# Patient Record
Sex: Male | Born: 1975 | Race: Black or African American | Hispanic: No | Marital: Married | Smoking: Never smoker
Health system: Southern US, Community
[De-identification: ages and names within clinical notes are randomized; demographics above are authoritative.]

## PROBLEM LIST (undated history)

## (undated) DIAGNOSIS — E7800 Pure hypercholesterolemia, unspecified: Secondary | ICD-10-CM

## (undated) DIAGNOSIS — I10 Essential (primary) hypertension: Secondary | ICD-10-CM

---

## 2011-12-15 ENCOUNTER — Emergency Department (HOSPITAL_COMMUNITY)
Admission: EM | Admit: 2011-12-15 | Discharge: 2011-12-15 | Disposition: A | Discharge status: Home / Self Care | Payer: Self-pay | Attending: Emergency Medicine | Admitting: Emergency Medicine

## 2011-12-15 ENCOUNTER — Emergency Department (HOSPITAL_COMMUNITY): Payer: Self-pay

## 2011-12-15 ENCOUNTER — Encounter (HOSPITAL_COMMUNITY): Payer: Self-pay | Admitting: *Deleted

## 2011-12-15 DIAGNOSIS — W3189XA Contact with other specified machinery, initial encounter: Secondary | ICD-10-CM | POA: Insufficient documentation

## 2011-12-15 DIAGNOSIS — E7800 Pure hypercholesterolemia, unspecified: Secondary | ICD-10-CM | POA: Insufficient documentation

## 2011-12-15 DIAGNOSIS — Y9289 Other specified places as the place of occurrence of the external cause: Secondary | ICD-10-CM | POA: Insufficient documentation

## 2011-12-15 DIAGNOSIS — S8010XA Contusion of unspecified lower leg, initial encounter: Secondary | ICD-10-CM | POA: Insufficient documentation

## 2011-12-15 HISTORY — DX: Pure hypercholesterolemia, unspecified: E78.00

## 2011-12-15 MED ORDER — HYDROCODONE-ACETAMINOPHEN 5-325 MG PO TABS
1.0000 | ORAL_TABLET | Freq: Once | ORAL | Status: AC
  Administered 2011-12-15: 1 via ORAL
  Filled 2011-12-15: qty 1

## 2011-12-15 MED ORDER — TRAMADOL HCL 50 MG PO TABS: 50.0000 mg | ORAL_TABLET | Freq: Four times a day (QID) | ORAL | Status: AC | PRN

## 2011-12-15 MED ORDER — IBUPROFEN 600 MG PO TABS: 600.0000 mg | ORAL_TABLET | Freq: Four times a day (QID) | ORAL | Status: AC | PRN

## 2011-12-15 NOTE — ED Notes (Signed)
PT had a electric scrubber hit him in the left shin area and has swelling and bruising to area.  Pulse present.    Unable to bear weight

## 2011-12-15 NOTE — ED Provider Notes (Signed)
History  This chart was scribed for Loren Racer, MD by Bennett Scrape. This patient was seen in room STRE1/STRE1 and the patient's care was started at 3:51PM.  CSN: [id number]  Arrival date & time 12/15/11  1531   First MD Initiated Contact with Patient 12/15/11 1551      Chief Complaint  Patient presents with  . Leg Pain    left    Patient is a 36 y.o. male presenting with leg pain. The history is provided by the patient. No language interpreter was used.  Leg Pain  The incident occurred 3 to 5 hours ago. The incident occurred at work. The injury mechanism was a direct blow. The pain is present in the left leg. The pain has been constant since onset. Pertinent negatives include no inability to bear weight. The symptoms are aggravated by bearing weight. He has tried nothing for the symptoms.     Dennis Beasley is a 36 y.o. male with a h/o hypercholesteremia who presents to the Emergency Department complaining of a LLE injury that occurred around 10:30AM today. Pt states that he had a electric floor scrubber hit him in the left shin area at his job. He has swelling and bruising to area. The pain is worse with walking but pt states that he has been able to walk on the left leg since the incident. He has not tried any treatments or OTC medications PTA. He denies any other associated symptoms currently. He denies smoking and alcohol use.  Past Medical History  Diagnosis Date  . Hypercholesteremia     History reviewed. No pertinent past surgical history.  No family history on file.  History  Substance Use Topics  . Smoking status: Never Smoker   . Smokeless tobacco: Not on file  . Alcohol Use: No      Review of Systems  Constitutional: Negative for fever and chills.  Respiratory: Negative for shortness of breath.   Gastrointestinal: Negative for nausea and vomiting.  Musculoskeletal:       LLE pain and swelling  Skin:       Contusion to the LLE  Neurological: Negative  for weakness.    Allergies  Review of patient's allergies indicates no known allergies.  Home Medications   Current Outpatient Rx  Name Route Sig Dispense Refill  . IBUPROFEN 600 MG PO TABS Oral Take 1 tablet (600 mg total) by mouth every 6 (six) hours as needed for pain. 30 tablet 0  . TRAMADOL HCL 50 MG PO TABS Oral Take 1 tablet (50 mg total) by mouth every 6 (six) hours as needed for pain. 15 tablet 0    Triage Vitals: BP 206/125  Pulse 108  Temp(Src) 98.1 F (36.7 C) (Oral)  Resp 21  SpO2 100%  Physical Exam  Nursing note and vitals reviewed. Constitutional: He is oriented to person, place, and time. He appears well-developed and well-nourished. No distress.  HENT:  Head: Normocephalic and atraumatic.  Eyes: EOM are normal.  Neck: Neck supple. No tracheal deviation present.  Cardiovascular: Normal rate.   Pulmonary/Chest: Effort normal. No respiratory distress.  Musculoskeletal: Normal range of motion.       Mild swelling and tenderness of the LLE around the contusion, no obvious deformity, no motor deficit  Neurological: He is alert and oriented to person, place, and time.       No sensatory deficit  Skin: Skin is warm and dry.       Contusion on the LLE   Psychiatric:  He has a normal mood and affect. His behavior is normal.    ED Course  Procedures (including critical care time)  DIAGNOSTIC STUDIES: Oxygen Saturation is 100% on room air, normal by my interpretation.    COORDINATION OF CARE: 4:00PM-Discussed treatment plan with pt and pt agreed to plan. 5:18PM-Informed pt of radiology report. Discussed discharge plan with pt and pt agreed to plan.  Labs Reviewed - No data to display Dg Tibia/fibula Left  12/15/2011  *RADIOLOGY REPORT*  Clinical Data: Injury over medial lower leg.  Pain.  LEFT TIBIA AND FIBULA - 2 VIEW  Comparison: None.  Findings: Soft tissue swelling over the anterior and medial aspect of the lower calf.  No underlying bony abnormality.  No  fracture, subluxation or dislocation.  IMPRESSION: No acute bony abnormality.  Original Report Authenticated By: Cyndie Chime, M.D.     1. Contusion of leg       MDM  I personally performed the services described in this documentation, which was scribed in my presence. The recorded information has been reviewed and considered.     Loren Racer, MD 12/15/11 [phone]

## 2011-12-15 NOTE — Discharge Instructions (Signed)

## 2014-07-29 ENCOUNTER — Emergency Department (HOSPITAL_COMMUNITY): Payer: Self-pay

## 2014-07-29 ENCOUNTER — Encounter (HOSPITAL_COMMUNITY): Payer: Self-pay | Admitting: Emergency Medicine

## 2014-07-29 ENCOUNTER — Emergency Department (HOSPITAL_COMMUNITY)
Admission: EM | Admit: 2014-07-29 | Discharge: 2014-07-29 | Disposition: A | Discharge status: Home / Self Care | Payer: Self-pay | Attending: Emergency Medicine | Admitting: Emergency Medicine

## 2014-07-29 DIAGNOSIS — I10 Essential (primary) hypertension: Secondary | ICD-10-CM | POA: Insufficient documentation

## 2014-07-29 DIAGNOSIS — J390 Retropharyngeal and parapharyngeal abscess: Secondary | ICD-10-CM

## 2014-07-29 DIAGNOSIS — R000 Tachycardia, unspecified: Secondary | ICD-10-CM | POA: Insufficient documentation

## 2014-07-29 DIAGNOSIS — J020 Streptococcal pharyngitis: Secondary | ICD-10-CM | POA: Insufficient documentation

## 2014-07-29 DIAGNOSIS — Z8639 Personal history of other endocrine, nutritional and metabolic disease: Secondary | ICD-10-CM | POA: Insufficient documentation

## 2014-07-29 DIAGNOSIS — J029 Acute pharyngitis, unspecified: Secondary | ICD-10-CM

## 2014-07-29 LAB — CBC WITH DIFFERENTIAL/PLATELET
Basophils Absolute: 0 10*3/uL (ref 0.0–0.1)
Basophils Relative: 0 % (ref 0–1)
Eosinophils Absolute: 0.1 10*3/uL (ref 0.0–0.7)
Eosinophils Relative: 1 % (ref 0–5)
HCT: 43.5 % (ref 39.0–52.0)
Hemoglobin: 14.8 g/dL (ref 13.0–17.0)
Lymphocytes Relative: 10 % — ABNORMAL LOW (ref 12–46)
Lymphs Abs: 1.8 10*3/uL (ref 0.7–4.0)
MCH: 28.5 pg (ref 26.0–34.0)
MCHC: 34 g/dL (ref 30.0–36.0)
MCV: 83.7 fL (ref 78.0–100.0)
MONO ABS: 1.7 10*3/uL — AB (ref 0.1–1.0)
Monocytes Relative: 10 % (ref 3–12)
NEUTROS ABS: 14.5 10*3/uL — AB (ref 1.7–7.7)
Neutrophils Relative %: 79 % — ABNORMAL HIGH (ref 43–77)
PLATELETS: 359 10*3/uL (ref 150–400)
RBC: 5.2 MIL/uL (ref 4.22–5.81)
RDW: 13 % (ref 11.5–15.5)
WBC: 18.1 10*3/uL — ABNORMAL HIGH (ref 4.0–10.5)

## 2014-07-29 LAB — BASIC METABOLIC PANEL
ANION GAP: 11 (ref 5–15)
BUN: 12 mg/dL (ref 6–23)
CHLORIDE: 96 meq/L (ref 96–112)
CO2: 28 mmol/L (ref 19–32)
CREATININE: 1.13 mg/dL (ref 0.50–1.35)
Calcium: 8.9 mg/dL (ref 8.4–10.5)
GFR calc Af Amer: >90 mL/min (ref >90)
GFR calc non Af Amer: 82 mL/min — ABNORMAL LOW (ref >90)
Glucose, Bld: 105 mg/dL — ABNORMAL HIGH (ref 70–99)
POTASSIUM: 3.4 mmol/L — AB (ref 3.5–5.1)
Sodium: 135 mmol/L (ref 135–145)

## 2014-07-29 LAB — RAPID STREP SCREEN (MED CTR MEBANE ONLY): STREPTOCOCCUS, GROUP A SCREEN (DIRECT): POSITIVE — AB

## 2014-07-29 MED ORDER — HYDROCODONE-ACETAMINOPHEN 5-325 MG PO TABS: 1.0000 | ORAL_TABLET | Freq: Four times a day (QID) | ORAL | Status: DC | PRN

## 2014-07-29 MED ORDER — KETAMINE HCL 10 MG/ML IJ SOLN: INTRAMUSCULAR | Status: AC | PRN

## 2014-07-29 MED ORDER — NAPROXEN 500 MG PO TABS: 500.0000 mg | ORAL_TABLET | Freq: Two times a day (BID) | ORAL | Status: DC | PRN

## 2014-07-29 MED ORDER — CLINDAMYCIN HCL 300 MG PO CAPS: 300.0000 mg | ORAL_CAPSULE | Freq: Four times a day (QID) | ORAL | Status: DC

## 2014-07-29 MED ORDER — SODIUM CHLORIDE 0.9 % IV BOLUS (SEPSIS)
1000.0000 mL | Freq: Once | INTRAVENOUS | Status: AC
  Administered 2014-07-29: 1000 mL via INTRAVENOUS

## 2014-07-29 MED ORDER — CLINDAMYCIN PHOSPHATE 900 MG/50ML IV SOLN
900.0000 mg | Freq: Once | INTRAVENOUS | Status: AC
  Administered 2014-07-29: 900 mg via INTRAVENOUS
  Filled 2014-07-29: qty 50

## 2014-07-29 MED ORDER — KETOROLAC TROMETHAMINE 30 MG/ML IJ SOLN
30.0000 mg | Freq: Once | INTRAMUSCULAR | Status: AC
  Administered 2014-07-29: 30 mg via INTRAVENOUS
  Filled 2014-07-29: qty 1

## 2014-07-29 MED ORDER — DEXAMETHASONE SODIUM PHOSPHATE 10 MG/ML IJ SOLN
10.0000 mg | Freq: Once | INTRAMUSCULAR | Status: AC
  Administered 2014-07-29: 10 mg via INTRAVENOUS
  Filled 2014-07-29: qty 1

## 2014-07-29 MED ORDER — IOHEXOL 300 MG/ML  SOLN
100.0000 mL | Freq: Once | INTRAMUSCULAR | Status: AC | PRN
  Administered 2014-07-29: 100 mL via INTRAVENOUS

## 2014-07-29 MED ORDER — HYDROCHLOROTHIAZIDE 25 MG PO TABS: 25.0000 mg | ORAL_TABLET | Freq: Every day | ORAL | Status: DC

## 2014-07-29 MED ORDER — LABETALOL HCL 5 MG/ML IV SOLN
10.0000 mg | Freq: Once | INTRAVENOUS | Status: AC
  Administered 2014-07-29: 10 mg via INTRAVENOUS
  Filled 2014-07-29: qty 4

## 2014-07-29 NOTE — ED Notes (Signed)
Patient transported to CT 

## 2014-07-29 NOTE — ED Notes (Signed)
Pt from home c/o sore throat since last Tuesday. Pt had a flu like illness last weekend.

## 2014-07-29 NOTE — ED Provider Notes (Signed)
CSN: [id number]     Arrival date & time 07/29/14  4098 History   First MD Initiated Contact with Patient 07/29/14 1030     Chief Complaint  Patient presents with  . Sore Throat     (Consider location/radiation/quality/duration/timing/severity/associated sxs/prior Treatment) HPI Comments: Dennis Beasley is a 39 y.o. male with a PMHx of HTN and HLD and noncompliant with medications, who presents to the ED with complaints of sore throat 4 days. Patient states that he has had a 10/10 aching constant pain in the back of his throat, left greater than right, nonradiating, worse with swallowing, and improved with Tylenol and ibuprofen. He endorses associated slight trismus, but denies drooling or muffled voice. 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 He denies any fevers, chills, chest pain, shortness of breath, rhinorrhea, eye or ear symptoms, vision changes, chest pain, shortness of breath, headache, cough, wheezing, abdominal pain, nausea, vomiting, diarrhea, dysuria, hematuria, numbness, tingling, or weakness. Of note, he has not taken any blood pressure medications in greater than one year, and he does not recall the name of the blood pressure medicine that he previously took.  Patient is a 39 y.o. male presenting with pharyngitis. The history is provided by the patient. No language interpreter was used.  Sore Throat This is a new problem. The current episode started in the past 7 days. The problem occurs constantly. The problem has been gradually worsening. Associated symptoms include a sore throat and swollen glands. Pertinent negatives include no abdominal pain, arthralgias, change in bowel habit, chest pain, chills, congestion, coughing, diaphoresis, fever, headaches, myalgias, nausea, neck pain, numbness, rash, urinary symptoms, visual change, vomiting or weakness. The symptoms are aggravated by swallowing. He has tried NSAIDs and acetaminophen for the symptoms. The treatment  provided moderate relief.    Past Medical History  Diagnosis Date  . Hypercholesteremia    History reviewed. No pertinent past surgical history. No family history on file. History  Substance Use Topics  . Smoking status: Never Smoker   . Smokeless tobacco: Not on file  . Alcohol Use: No    Review of Systems  Constitutional: Negative for fever, chills and diaphoresis.  HENT: Positive for sore throat and trouble swallowing. Negative for congestion, drooling, ear discharge, ear pain, facial swelling and rhinorrhea.        +trismus  Eyes: Negative for pain, discharge, redness, itching and visual disturbance.  Respiratory: Negative for cough, chest tightness and shortness of breath.   Cardiovascular: Negative for chest pain, palpitations and leg swelling.  Gastrointestinal: Negative for nausea, vomiting, abdominal pain, diarrhea, constipation and change in bowel habit.  Genitourinary: Negative for dysuria and hematuria.  Musculoskeletal: Negative for myalgias, back pain, arthralgias and neck pain.  Skin: Negative for rash.  Allergic/Immunologic: Negative for environmental allergies and immunocompromised state.  Neurological: Negative for dizziness, syncope, weakness, light-headedness, numbness and headaches.    10 Systems reviewed and are negative for acute change except as noted in the HPI.   Allergies  Review of patient's allergies indicates no known allergies.  Home Medications   Prior to Admission medications   Medication Sig Start Date End Date Taking? Authorizing Provider  acetaminophen (TYLENOL) 500 MG tablet Take 1,000 mg by mouth every 6 (six) hours as needed for mild pain or moderate pain.   Yes Historical Provider, MD  ibuprofen (ADVIL,MOTRIN) 200 MG tablet Take 200 mg by mouth every 6 (six) hours as needed for mild pain.   Yes Historical Provider, MD  BP 223/137 mmHg  Pulse 114  Temp(Src) 99.7 F (37.6 C) (Oral)  Resp 18  SpO2 100% Physical Exam   Constitutional: He is oriented to person, place, and time. He appears well-developed and well-nourished.  Non-toxic appearance. No distress.  Afebrile, nontoxic, tachycardic at 114, hypertensive at 223/137, NAD but does appear to feel ill  HENT:  Head: Normocephalic and atraumatic.  Right Ear: Hearing, tympanic membrane, external ear and ear canal normal.  Left Ear: Hearing, tympanic membrane, external ear and ear canal normal.  Nose: Nose normal. Right sinus exhibits no maxillary sinus tenderness and no frontal sinus tenderness. Left sinus exhibits no maxillary sinus tenderness and no frontal sinus tenderness.  Mouth/Throat: Uvula is midline. Mucous membranes are dry. There is trismus in the jaw. Uvula swelling present. Oropharyngeal exudate, posterior oropharyngeal edema, posterior oropharyngeal erythema and tonsillar abscesses present.  Ears clear bilaterally, nose clear, no sinus TTP Uvula erythematous and swollen with trismus, L tonsillar swelling concerning for PTA, oropharynx erythematous and edematous, patent airway, +exudates bilaterally Dry mucous membranes Subfloor of mouth not indurated, no ludwig's  Eyes: Conjunctivae and EOM are normal. Right eye exhibits no discharge. Left eye exhibits no discharge.  Neck: Normal range of motion and phonation normal. Neck supple. No JVD present. Carotid bruit is not present.  Voice not muffled or stridorous FROM intact at neck No JVD or carotid bruit  Cardiovascular: Regular rhythm, normal heart sounds and intact distal pulses.  Tachycardia present.  Exam reveals no gallop and no friction rub.   No murmur heard. Tachycardic, reg rhythm, nl s1/s2, no m/r/g, distal pulses intact, no pedal edema  Pulmonary/Chest: Effort normal and breath sounds normal. No stridor. No respiratory distress. He has no decreased breath sounds. He has no wheezes. He has no rhonchi. He has no rales.  Abdominal: Soft. Normal appearance and bowel sounds are normal. He  exhibits no distension. There is no tenderness. There is no rigidity, no rebound and no guarding.  Musculoskeletal: Normal range of motion.  MAE x4 No pedal edema Strength 5/5 in all extremities Sensation grossly intact Gait steady  Lymphadenopathy:       Head (left side): Tonsillar adenopathy present.    He has cervical adenopathy.  +tender L sided tonsillar LAD Anterior cervical LAD shotty throughout  Neurological: He is alert and oriented to person, place, and time. He has normal strength. No sensory deficit. Gait normal.  Skin: Skin is warm, dry and intact. No rash noted.  Psychiatric: He has a normal mood and affect.  Nursing note and vitals reviewed.   ED Course  Procedures (including critical care time) Labs Review Labs Reviewed  RAPID STREP SCREEN - Abnormal; Notable for the following:    Streptococcus, Group A Screen (Direct) POSITIVE (*)    All other components within normal limits  CBC WITH DIFFERENTIAL - Abnormal; Notable for the following:    WBC 18.1 (*)    Neutrophils Relative % 79 (*)    Neutro Abs 14.5 (*)    Lymphocytes Relative 10 (*)    Monocytes Absolute 1.7 (*)    All other components within normal limits  BASIC METABOLIC PANEL - Abnormal; Notable for the following:    Potassium 3.4 (*)    Glucose, Bld 105 (*)    GFR calc non Af Amer 82 (*)    All other components within normal limits    Imaging Review Ct Soft Tissue Neck W Contrast  07/29/2014   CLINICAL DATA:  Pharyngitis for the past 4  days with pain greater on the left. Clinical concern for abscess.  EXAM: CT NECK WITH CONTRAST  TECHNIQUE: Multidetector CT imaging of the neck was performed using the standard protocol following the bolus administration of intravenous contrast.  CONTRAST:  100mL OMNIPAQUE IOHEXOL 300 MG/ML  SOLN  COMPARISON:  None.  FINDINGS: Pharynx and larynx: Left parapharyngeal soft tissue thickening with a 1.4 x 1.0 cm oval area of slightly more focally decreased density centrally  on image number 49. The soft tissue thickening extends inferiorly into the lateral aspect of the vallecula and into the piriform sinus on the left. The vocal cords and subglottic region appear normal.  Salivary glands: Normal.  Thyroid: Normal.  Lymph nodes: Multiple enlarged lymph nodes in the neck bilaterally, greater on the left. The largest is a posterior triangle node on the left, measuring 12 mm in short axis diameter on image number 57.  Vascular: Unremarkable.  Limited intracranial: Unremarkable.  Mastoids and visualized paranasal sinuses: Unremarkable.  Skeleton: Unremarkable.  Upper chest: Unremarkable.  IMPRESSION: 1. Left parapharyngeal cellulitis with a a 1.4 x 1.0 cm probable developing abscess. The soft tissue thickening extends inferiorly into the left vallecula and piriform sinus. 2. Bilateral cervical reactive adenopathy, greater on the left.   Electronically Signed   By: Gordan PaymentSteve  Reid M.D.   On: 07/29/2014 12:56     EKG Interpretation   Date/Time:  Saturday July 29 2014 11:16:31 EST Ventricular Rate:  108 PR Interval:  169 QRS Duration: 81 QT Interval:  338 QTC Calculation: 453 R Axis:   36 Text Interpretation:  Sinus tachycardia Borderline ST elevation, anterior  leads Confirmed by NANAVATI, MD, ANKIT ([id number]) on 07/29/2014 11:21:44 AM      MDM   Final diagnoses:  Sore throat  Cellulitis of parapharyngeal space  Strep pharyngitis  HTN (hypertension) with goal to be determined  Tachycardia    39 y.o. male with untreated HTN, here for sore throat. Tachycardic, mild trismus, uvular swelling without deviation but concern for PTA. Doubt Ludwig's. Given presentation, will get CT neck to check for PTA and extension. RST positive. Will also get labs for HTN, and give labetalol. Will get EKG. Will give decadron, toradol, bolus fluids, and reassess. Will give clinda dose once CT done.  1:58 PM CBC w/diff showing leukocytosis at 18.1, neutrophilic predominance. BMP WNL aside  from mildly low K, likely will correct with fluids given. CT soft tissue neck showing L parapharyngeal cellulitis with developing abscess but no obvious abscess that could be drained. Pt getting second liter of fluids. BP initially didn't respond to 10mg  labetalol, therefore gave 10mg  more. Some confusion with VS, there was a patient mix up therefore will repeat VS now to see if he's responded. IV Clinda going in now. Pain greatly improved and pt tolerating PO intake now. Will reassess VS and then plan for D/C with ENT f/up.  3:08 PM VS updated, appears that his BP is still 210/126 and still tachycardic in 110s. Pt continues to be asymptomatic and feels improvement in his throat pain. Agreed to discharge with HCTZ 25mg  daily and close f/up with his PCP. Will have him start on low salt diet. Strict return precautions emphasized. Instructed importance of good hydration and if symptoms worsen to call 911. I explained the diagnosis and have given explicit precautions to return to the ER including for any other new or worsening symptoms. The patient understands and accepts the medical plan as it's been dictated and I have answered their questions. Discharge  instructions concerning home care and prescriptions have been given. The patient is STABLE and is discharged to home in good condition.  BP 210/126 mmHg  Pulse 113  Temp(Src) 98.4 F (36.9 C) (Oral)  Resp 22  SpO2 94%  Meds ordered this encounter  Medications  . sodium chloride 0.9 % bolus 1,000 mL    Sig:   . labetalol (NORMODYNE,TRANDATE) injection 10 mg    Sig:   . ketorolac (TORADOL) 30 MG/ML injection 30 mg    Sig:   . dexamethasone (DECADRON) injection 10 mg    Sig:   . iohexol (OMNIPAQUE) 300 MG/ML solution 100 mL    Sig:   . labetalol (NORMODYNE,TRANDATE) injection 10 mg    Sig:   . sodium chloride 0.9 % bolus 1,000 mL    Sig:   . clindamycin (CLEOCIN) IVPB 900 mg    Sig:     Order Specific Question:  Antibiotic Indication:     Answer:  Other Indication (list below)    Order Specific Question:  Other Indication:    Answer:  parapharyngeal abscess  . ketamine (KETALAR) injection    Sig:   . clindamycin (CLEOCIN) 300 MG capsule    Sig: Take 1 capsule (300 mg total) by mouth 4 (four) times daily. X 7 days    Dispense:  28 capsule    Refill:  0    Order Specific Question:  Supervising Provider    Answer:  Eber Hong D [[id number]]  . naproxen (NAPROSYN) 500 MG tablet    Sig: Take 1 tablet (500 mg total) by mouth 2 (two) times daily as needed for mild pain, moderate pain or headache (TAKE WITH MEALS.).    Dispense:  20 tablet    Refill:  0    Order Specific Question:  Supervising Provider    Answer:  Eber Hong D [[id number]]  . HYDROcodone-acetaminophen (NORCO) 5-325 MG per tablet    Sig: Take 1-2 tablets by mouth every 6 (six) hours as needed for severe pain.    Dispense:  10 tablet    Refill:  0    Order Specific Question:  Supervising Provider    Answer:  Eber Hong D [[id number]]  . hydrochlorothiazide (HYDRODIURIL) 25 MG tablet    Sig: Take 1 tablet (25 mg total) by mouth daily.    Dispense:  30 tablet    Refill:  1    Order Specific Question:  Supervising Provider    Answer:  Vida Roller [address] [place], PA-C 07/29/14 1510  Derwood Kaplan, MD 07/30/14 [phone]

## 2014-07-29 NOTE — ED Notes (Signed)
MD at bedside. 

## 2014-07-29 NOTE — Discharge Instructions (Signed)
Continue to stay well-hydrated. Gargle warm salt water and spit it out, use chloraseptic spray for pain relief. Continue to alternate between Tylenol and naprosyn for pain or fever. Use vicodin as directed as needed for severe pain, don't drive while using this medicine. Start taking hydrochlorothiazide as directed, eat low-salt diet, and monitor your blood pressure daily. Call ENT specialist listed above on Tuesday for a follow up appointment but if your airway becomes swollen or you can't breath, can't swallow, or can't open your mouth, call 911 and return to the ER immediately. Followup with your primary care doctor in 5-7 days for recheck of blood pressure. Return to emergency department for emergent changing or worsening of symptoms.   Pharyngitis Pharyngitis is redness, pain, and swelling (inflammation) of your pharynx.  CAUSES  Pharyngitis is usually caused by infection. Most of the time, these infections are from viruses (viral) and are part of a cold. However, sometimes pharyngitis is caused by bacteria (bacterial). Pharyngitis can also be caused by allergies. Viral pharyngitis may be spread from person to person by coughing, sneezing, and personal items or utensils (cups, forks, spoons, toothbrushes). Bacterial pharyngitis may be spread from person to person by more intimate contact, such as kissing.  SIGNS AND SYMPTOMS  Symptoms of pharyngitis include:   Sore throat.   Tiredness (fatigue).   Low-grade fever.   Headache.  Joint pain and muscle aches.  Skin rashes.  Swollen lymph nodes.  Plaque-like film on throat or tonsils (often seen with bacterial pharyngitis). DIAGNOSIS  Your health care provider will ask you questions about your illness and your symptoms. Your medical history, along with a physical exam, is often all that is needed to diagnose pharyngitis. Sometimes, a rapid strep test is done. Other lab tests may also be done, depending on the suspected cause.    TREATMENT  Viral pharyngitis will usually get better in 3-4 days without the use of medicine. Bacterial pharyngitis is treated with medicines that kill germs (antibiotics).  HOME CARE INSTRUCTIONS   Drink enough water and fluids to keep your urine clear or pale yellow.   Only take over-the-counter or prescription medicines as directed by your health care provider:   If you are prescribed antibiotics, make sure you finish them even if you start to feel better.   Do not take aspirin.   Get lots of rest.   Gargle with 8 oz of salt water ( tsp of salt per 1 qt of water) as often as every 1-2 hours to soothe your throat.   Throat lozenges (if you are not at risk for choking) or sprays may be used to soothe your throat. SEEK MEDICAL CARE IF:   You have large, tender lumps in your neck.  You have a rash.  You cough up green, yellow-brown, or bloody spit. SEEK IMMEDIATE MEDICAL CARE IF:   Your neck becomes stiff.  You drool or are unable to swallow liquids.  You vomit or are unable to keep medicines or liquids down.  You have severe pain that does not go away with the use of recommended medicines.  You have trouble breathing (not caused by a stuffy nose). MAKE SURE YOU:   Understand these instructions.  Will watch your condition.  Will get help right away if you are not doing well or get worse. Document Released: 06/30/2005 Document Revised: 04/20/2013 Document Reviewed: 03/07/2013 [facility] Patient Information 2015 [place], [place]. This information is not intended to replace advice given to you by your health care provider.  Make sure you discuss any questions you have with your health care provider.  Retropharyngeal Abscess A retropharyngeal abscess is an abscess that is located in the back of the throat. An abscess is an area infected by bacteria and contains a collection of pus.  SYMPTOMS   On one or both sides of the back of the throat, an abscess usually  causes:  Swelling.  Pain.  Tenderness.  Inflammation.  High fever.  Stiff neck.  Occasionally, difficulty breathing.  Difficulty or pain when swallowing or both.  Muffled voice. DIAGNOSIS  The diagnosis is often made after a physical exam. Sometimes specialized X-ray exams are done. TREATMENT  The treatment of retropharyngeal abscess usually consists of:  Antibiotic medicines.  Drainage (an incision is made over the abscess, and the pus is drained out of the abscess). HOME CARE INSTRUCTIONS  Only take over-the-counter or prescription medicines for pain, discomfort, or fever as directed by your health care provider.  SEEK MEDICAL CARE IF:  You cannot swallow or you are starting to drool because of pain when you swallow.  You are not able to lie flat without feeling short of breath. SEEK IMMEDIATE MEDICAL CARE IF:   You develop increased pain, swelling, drainage, or bleeding in the wound site.  You develop signs of generalized infection including muscle aches, chills, fever, or a general ill feeling.  You develop a stiff neck or severe headache not relieved with medicines.  You have a fever.  You have a worsening of any of the problems that brought you to your health care provider. MAKE SURE YOU:  Understand these instructions.   Will watch your condition.   Will get help right away if you are not doing well or get worse. Document Released: 10/12/2000 Document Revised: 04/20/2013 Document Reviewed: 01/25/2013 [facility] Patient Information 2015 [place], [place]. This information is not intended to replace advice given to you by your health care provider. Make sure you discuss any questions you have with your health care provider.  Salt Water Gargle This solution will help make your mouth and throat feel better. HOME CARE INSTRUCTIONS   Mix 1 teaspoon of salt in 8 ounces of warm water.  Gargle with this solution as much or often as you need or as directed.  Swish and gargle gently if you have any sores or wounds in your mouth.  Do not swallow this mixture. Document Released: 04/03/2004 Document Revised: 09/22/2011 Document Reviewed: 08/25/2008 [facility] Patient Information 2015 [place], [place]. This information is not intended to replace advice given to you by your health care provider. Make sure you discuss any questions you have with your health care provider.  Hypertension Hypertension, commonly called high blood pressure, is when the force of blood pumping through your arteries is too strong. Your arteries are the blood vessels that carry blood from your heart throughout your body. A blood pressure reading consists of a higher number over a lower number, such as 110/72. The higher number (systolic) is the pressure inside your arteries when your heart pumps. The lower number (diastolic) is the pressure inside your arteries when your heart relaxes. Ideally you want your blood pressure below 120/80. Hypertension forces your heart to work harder to pump blood. Your arteries may become narrow or stiff. Having hypertension puts you at risk for heart disease, stroke, and other problems.  RISK FACTORS Some risk factors for high blood pressure are controllable. Others are not.  Risk factors you cannot control include:   Race. You may be  at higher risk if you are African American.  Age. Risk increases with age.  Gender. Men are at higher risk than women before age 18 years. After age 52, women are at higher risk than men. Risk factors you can control include:  Not getting enough exercise or physical activity.  Being overweight.  Getting too much fat, sugar, calories, or salt in your diet.  Drinking too much alcohol. SIGNS AND SYMPTOMS Hypertension does not usually cause signs or symptoms. Extremely high blood pressure (hypertensive crisis) may cause headache, anxiety, shortness of breath, and nosebleed. DIAGNOSIS  To check if you have  hypertension, your health care provider will measure your blood pressure while you are seated, with your arm held at the level of your heart. It should be measured at least twice using the same arm. Certain conditions can cause a difference in blood pressure between your right and left arms. A blood pressure reading that is higher than normal on one occasion does not mean that you need treatment. If one blood pressure reading is high, ask your health care provider about having it checked again. TREATMENT  Treating high blood pressure includes making lifestyle changes and possibly taking medicine. Living a healthy lifestyle can help lower high blood pressure. You may need to change some of your habits. Lifestyle changes may include:  Following the DASH diet. This diet is high in fruits, vegetables, and whole grains. It is low in salt, red meat, and added sugars.  Getting at least 2 hours of brisk physical activity every week.  Losing weight if necessary.  Not smoking.  Limiting alcoholic beverages.  Learning ways to reduce stress. If lifestyle changes are not enough to get your blood pressure under control, your health care provider may prescribe medicine. You may need to take more than one. Work closely with your health care provider to understand the risks and benefits. HOME CARE INSTRUCTIONS  Have your blood pressure rechecked as directed by your health care provider.   Take medicines only as directed by your health care provider. Follow the directions carefully. Blood pressure medicines must be taken as prescribed. The medicine does not work as well when you skip doses. Skipping doses also puts you at risk for problems.   Do not smoke.   Monitor your blood pressure at home as directed by your health care provider. SEEK MEDICAL CARE IF:   You think you are having a reaction to medicines taken.  You have recurrent headaches or feel dizzy.  You have swelling in your  ankles.  You have trouble with your vision. SEEK IMMEDIATE MEDICAL CARE IF:  You develop a severe headache or confusion.  You have unusual weakness, numbness, or feel faint.  You have severe chest or abdominal pain.  You vomit repeatedly.  You have trouble breathing. MAKE SURE YOU:   Understand these instructions.  Will watch your condition.  Will get help right away if you are not doing well or get worse. Document Released: 06/30/2005 Document Revised: 11/14/2013 Document Reviewed: 04/22/2013 [facility] Patient Information 2015 [place], [place]. This information is not intended to replace advice given to you by your health care provider. Make sure you discuss any questions you have with your health care provider.  Managing Your High Blood Pressure Blood pressure is a measurement of how forceful your blood is pressing against the walls of the arteries. Arteries are muscular tubes within the circulatory system. Blood pressure does not stay the same. Blood pressure rises when you are active,  excited, or nervous; and it lowers during sleep and relaxation. If the numbers measuring your blood pressure stay above normal most of the time, you are at risk for health problems. High blood pressure (hypertension) is a long-term (chronic) condition in which blood pressure is elevated. A blood pressure reading is recorded as two numbers, such as 120 over 80 (or 120/80). The first, higher number is called the systolic pressure. It is a measure of the pressure in your arteries as the heart beats. The second, lower number is called the diastolic pressure. It is a measure of the pressure in your arteries as the heart relaxes between beats.  Keeping your blood pressure in a normal range is important to your overall health and prevention of health problems, such as heart disease and stroke. When your blood pressure is uncontrolled, your heart has to work harder than normal. High blood pressure is a very common  condition in adults because blood pressure tends to rise with age. Men and women are equally likely to have hypertension but at different times in life. Before age 79, men are more likely to have hypertension. After 37 years of age, women are more likely to have it. Hypertension is especially common in African Americans. This condition often has no signs or symptoms. The cause of the condition is usually not known. Your caregiver can help you come up with a plan to keep your blood pressure in a normal, healthy range. BLOOD PRESSURE STAGES Blood pressure is classified into four stages: normal, prehypertension, stage 1, and stage 2. Your blood pressure reading will be used to determine what type of treatment, if any, is necessary. Appropriate treatment options are tied to these four stages:  Normal  Systolic pressure (mm Hg): below 120.  Diastolic pressure (mm Hg): below 80. Prehypertension  Systolic pressure (mm Hg): 120 to 139.  Diastolic pressure (mm Hg): 80 to 89. Stage1  Systolic pressure (mm Hg): 140 to 159.  Diastolic pressure (mm Hg): 90 to 99. Stage2  Systolic pressure (mm Hg): 160 or above.  Diastolic pressure (mm Hg): 100 or above. RISKS RELATED TO HIGH BLOOD PRESSURE Managing your blood pressure is an important responsibility. Uncontrolled high blood pressure can lead to:  A heart attack.  A stroke.  A weakened blood vessel (aneurysm).  Heart failure.  Kidney damage.  Eye damage.  Metabolic syndrome.  Memory and concentration problems. HOW TO MANAGE YOUR BLOOD PRESSURE Blood pressure can be managed effectively with lifestyle changes and medicines (if needed). Your caregiver will help you come up with a plan to bring your blood pressure within a normal range. Your plan should include the following: Education  Read all information provided by your caregivers about how to control blood pressure.  Educate yourself on the latest guidelines and treatment  recommendations. New research is always being done to further define the risks and treatments for high blood pressure. Lifestylechanges  Control your weight.  Avoid smoking.  Stay physically active.  Reduce the amount of salt in your diet.  Reduce stress.  Control any chronic conditions, such as high cholesterol or diabetes.  Reduce your alcohol intake. Medicines  Several medicines (antihypertensive medicines) are available, if needed, to bring blood pressure within a normal range. Communication  Review all the medicines you take with your caregiver because there may be side effects or interactions.  Talk with your caregiver about your diet, exercise habits, and other lifestyle factors that may be contributing to high blood pressure.  See your  caregiver regularly. Your caregiver can help you create and adjust your plan for managing high blood pressure. RECOMMENDATIONS FOR TREATMENT AND FOLLOW-UP  The following recommendations are based on current guidelines for managing high blood pressure in nonpregnant adults. Use these recommendations to identify the proper follow-up period or treatment option based on your blood pressure reading. You can discuss these options with your caregiver.  Systolic pressure of 120 to 139 or diastolic pressure of 80 to 89: Follow up with your caregiver as directed.  Systolic pressure of 140 to 160 or diastolic pressure of 90 to 100: Follow up with your caregiver within 2 months.  Systolic pressure above 160 or diastolic pressure above 100: Follow up with your caregiver within 1 month.  Systolic pressure above 180 or diastolic pressure above 110: Consider antihypertensive therapy; follow up with your caregiver within 1 week.  Systolic pressure above 200 or diastolic pressure above 120: Begin antihypertensive therapy; follow up with your caregiver within 1 week. Document Released: 03/24/2012 Document Reviewed: 03/24/2012 [facility] Patient Information  2015 [place], [place]. This information is not intended to replace advice given to you by your health care provider. Make sure you discuss any questions you have with your health care provider.

## 2015-06-03 IMAGING — CT CT NECK W/ CM
2 of 3 series · 8 of 14 positions shown, 10 images · IV contrast (omnipaque)
Comparison: None.

CLINICAL DATA: Pharyngitis for the past 4 days with pain greater on
the left. Clinical concern for abscess.

EXAM:
CT NECK WITH CONTRAST
TECHNIQUE: Multidetector CT imaging of the neck was performed using the
standard protocol following the bolus administration of intravenous
contrast.
CONTRAST:  100mL OMNIPAQUE IOHEXOL 300 MG/ML  SOLN

[Series 3: neck with st · axial · 0.39mm/px · z∈[-807,-679]mm · 3 of 129 slices shown]
[im 33/129  bone]
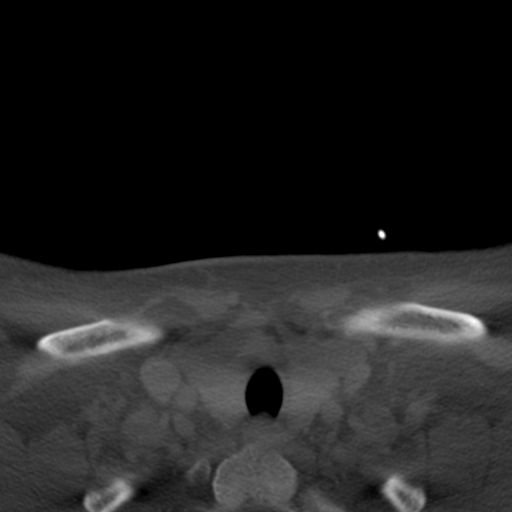
[im 65/129  bone]
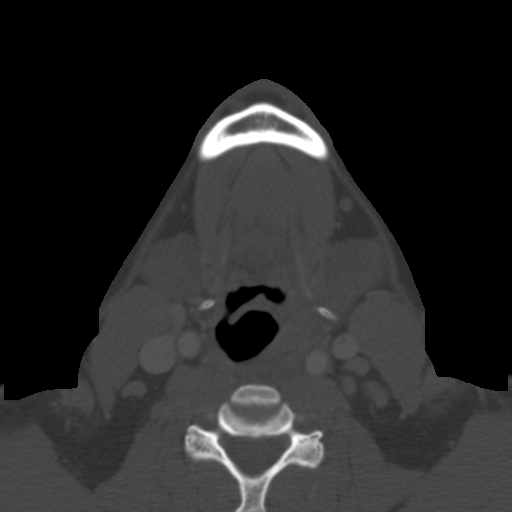
[im 97/129  bone]
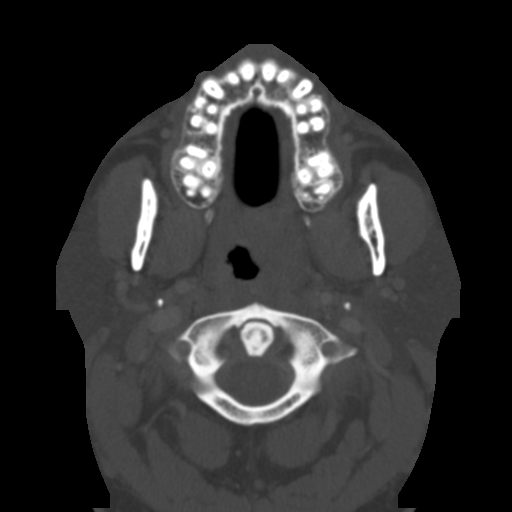

[Series 8: axial recons · axial · 0.39mm/px · z∈[-878,-680]mm · 5 of 160 slices shown, 7 images]
[im 27/160  soft-tissue]
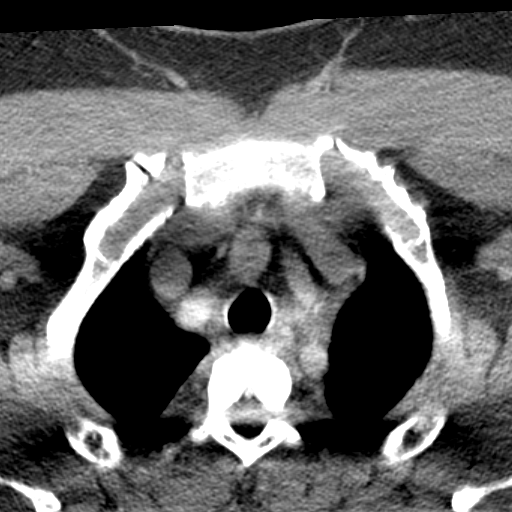
[im 27/160  bone]
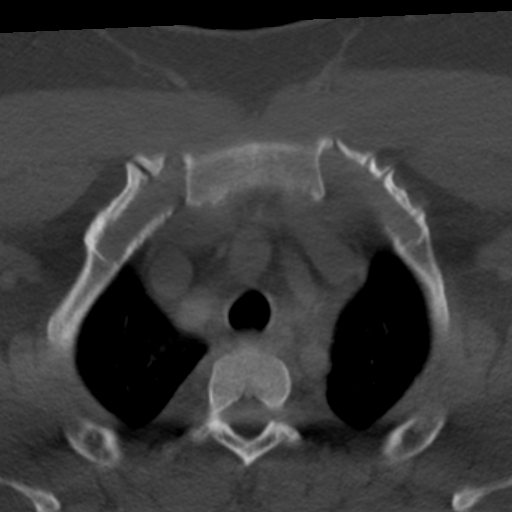
[im 54/160  bone]
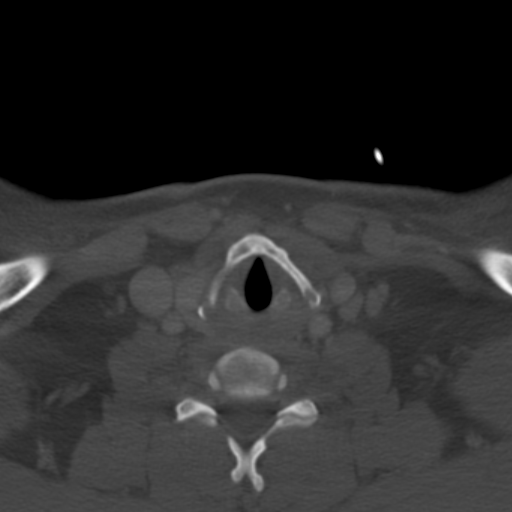
[im 80/160  bone]
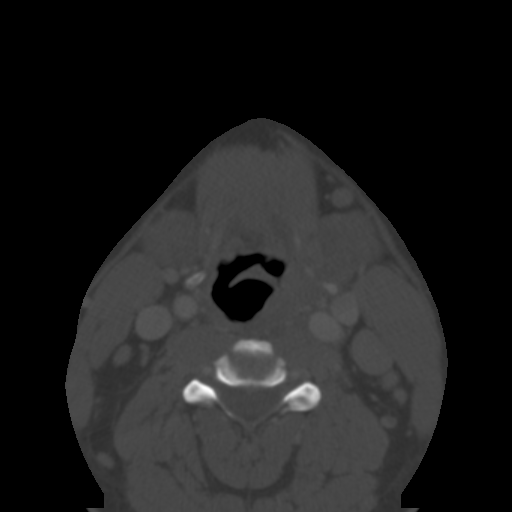
[im 107/160  bone]
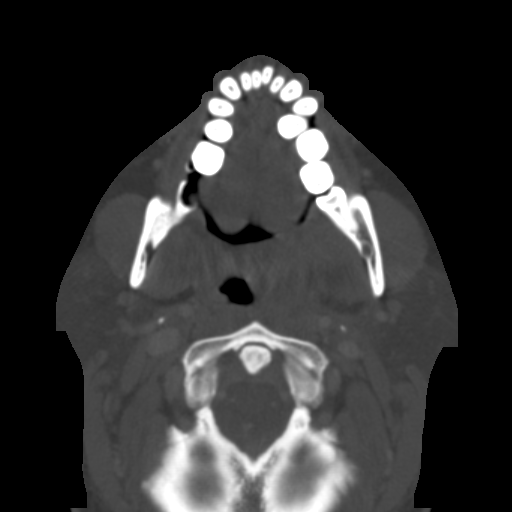
[im 133/160  soft-tissue]
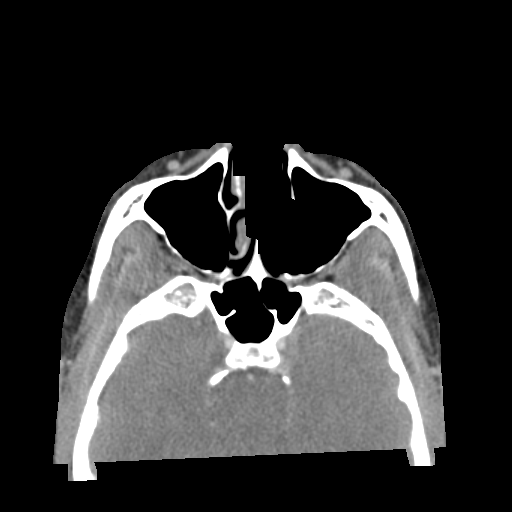
[im 133/160  bone]
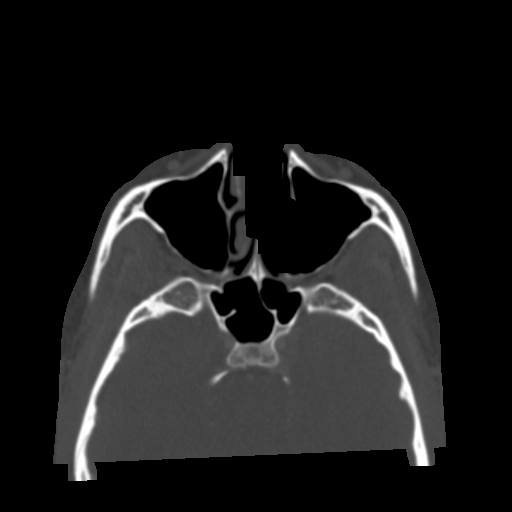

[8 of 14 positions shown; findings below may reference images not displayed]

FINDINGS: Pharynx and larynx: Left parapharyngeal soft tissue thickening with
a 1.4 x 1.0 cm oval area of slightly more focally decreased density
centrally on image number 49. The soft tissue thickening extends
inferiorly into the lateral aspect of the vallecula and into the
piriform sinus on the left. The vocal cords and subglottic region
appear normal.

Salivary glands: Normal.

Thyroid: Normal.

Lymph nodes: Multiple enlarged lymph nodes in the neck bilaterally,
greater on the left. The largest is a posterior triangle node on the
left, measuring 12 mm in short axis diameter on image number 57.

Vascular: Unremarkable.

Limited intracranial: Unremarkable.

Mastoids and visualized paranasal sinuses: Unremarkable.

Skeleton: Unremarkable.

Upper chest: Unremarkable.
IMPRESSION: 1. Left parapharyngeal cellulitis with a a 1.4 x 1.0 cm probable
developing abscess. The soft tissue thickening extends inferiorly
into the left vallecula and piriform sinus.
2. Bilateral cervical reactive adenopathy, greater on the left.

## 2016-02-05 ENCOUNTER — Ambulatory Visit: Payer: Self-pay | Admitting: Family Medicine

## 2016-06-17 ENCOUNTER — Emergency Department (HOSPITAL_COMMUNITY): Payer: BC Managed Care – PPO

## 2016-06-17 ENCOUNTER — Inpatient Hospital Stay (HOSPITAL_COMMUNITY)
Admission: EM | Admit: 2016-06-17 | Discharge: 2016-06-20 | DRG: 305 | Disposition: A | Discharge status: Home / Self Care | Payer: BC Managed Care – PPO | Attending: Internal Medicine | Admitting: Internal Medicine

## 2016-06-17 ENCOUNTER — Encounter (HOSPITAL_COMMUNITY): Payer: Self-pay | Admitting: Emergency Medicine

## 2016-06-17 DIAGNOSIS — I160 Hypertensive urgency: Secondary | ICD-10-CM

## 2016-06-17 DIAGNOSIS — Z9114 Patient's other noncompliance with medication regimen: Secondary | ICD-10-CM | POA: Diagnosis not present

## 2016-06-17 DIAGNOSIS — E7800 Pure hypercholesterolemia, unspecified: Secondary | ICD-10-CM | POA: Diagnosis not present

## 2016-06-17 DIAGNOSIS — I5042 Chronic combined systolic (congestive) and diastolic (congestive) heart failure: Secondary | ICD-10-CM

## 2016-06-17 DIAGNOSIS — I130 Hypertensive heart and chronic kidney disease with heart failure and stage 1 through stage 4 chronic kidney disease, or unspecified chronic kidney disease: Secondary | ICD-10-CM | POA: Diagnosis present

## 2016-06-17 DIAGNOSIS — R05 Cough: Secondary | ICD-10-CM | POA: Diagnosis not present

## 2016-06-17 DIAGNOSIS — N189 Chronic kidney disease, unspecified: Secondary | ICD-10-CM | POA: Diagnosis not present

## 2016-06-17 DIAGNOSIS — N289 Disorder of kidney and ureter, unspecified: Secondary | ICD-10-CM

## 2016-06-17 DIAGNOSIS — N179 Acute kidney failure, unspecified: Secondary | ICD-10-CM | POA: Diagnosis not present

## 2016-06-17 DIAGNOSIS — I161 Hypertensive emergency: Secondary | ICD-10-CM | POA: Diagnosis not present

## 2016-06-17 DIAGNOSIS — Z6841 Body Mass Index (BMI) 40.0 and over, adult: Secondary | ICD-10-CM

## 2016-06-17 DIAGNOSIS — I10 Essential (primary) hypertension: Secondary | ICD-10-CM | POA: Diagnosis not present

## 2016-06-17 DIAGNOSIS — I5030 Unspecified diastolic (congestive) heart failure: Secondary | ICD-10-CM | POA: Diagnosis not present

## 2016-06-17 DIAGNOSIS — I425 Other restrictive cardiomyopathy: Secondary | ICD-10-CM | POA: Diagnosis not present

## 2016-06-17 HISTORY — DX: Essential (primary) hypertension: I10

## 2016-06-17 LAB — URINALYSIS, ROUTINE W REFLEX MICROSCOPIC
BILIRUBIN URINE: NEGATIVE
Bacteria, UA: NONE SEEN
Glucose, UA: NEGATIVE mg/dL
Hgb urine dipstick: NEGATIVE
KETONES UR: NEGATIVE mg/dL
LEUKOCYTES UA: NEGATIVE
NITRITE: NEGATIVE
PH: 5 (ref 5.0–8.0)
Protein, ur: 30 mg/dL — AB
RBC / HPF: NONE SEEN RBC/hpf (ref 0–5)
SPECIFIC GRAVITY, URINE: 1.019 (ref 1.005–1.030)
SQUAMOUS EPITHELIAL / LPF: NONE SEEN

## 2016-06-17 LAB — CBC WITH DIFFERENTIAL/PLATELET
Basophils Absolute: 0 10*3/uL (ref 0.0–0.1)
Basophils Relative: 1 %
EOS ABS: 0.2 10*3/uL (ref 0.0–0.7)
EOS PCT: 3 %
HCT: 44 % (ref 39.0–52.0)
Hemoglobin: 14.7 g/dL (ref 13.0–17.0)
LYMPHS ABS: 2.3 10*3/uL (ref 0.7–4.0)
LYMPHS PCT: 30 %
MCH: 28.1 pg (ref 26.0–34.0)
MCHC: 33.4 g/dL (ref 30.0–36.0)
MCV: 84 fL (ref 78.0–100.0)
MONOS PCT: 9 %
Monocytes Absolute: 0.6 10*3/uL (ref 0.1–1.0)
Neutro Abs: 4.4 10*3/uL (ref 1.7–7.7)
Neutrophils Relative %: 57 %
PLATELETS: 358 10*3/uL (ref 150–400)
RBC: 5.24 MIL/uL (ref 4.22–5.81)
RDW: 13.9 % (ref 11.5–15.5)
WBC: 7.6 10*3/uL (ref 4.0–10.5)

## 2016-06-17 LAB — BASIC METABOLIC PANEL
Anion gap: 7 (ref 5–15)
BUN: 20 mg/dL (ref 6–20)
CHLORIDE: 103 mmol/L (ref 101–111)
CO2: 30 mmol/L (ref 22–32)
CREATININE: 1.6 mg/dL — AB (ref 0.61–1.24)
Calcium: 8.6 mg/dL — ABNORMAL LOW (ref 8.9–10.3)
GFR calc Af Amer: >60 mL/min (ref >60)
GFR calc non Af Amer: 53 mL/min — ABNORMAL LOW (ref >60)
GLUCOSE: 92 mg/dL (ref 65–99)
POTASSIUM: 3.4 mmol/L — AB (ref 3.5–5.1)
SODIUM: 140 mmol/L (ref 135–145)

## 2016-06-17 LAB — RAPID URINE DRUG SCREEN, HOSP PERFORMED
AMPHETAMINES: NOT DETECTED
Barbiturates: NOT DETECTED
Benzodiazepines: NOT DETECTED
Cocaine: NOT DETECTED
Opiates: NOT DETECTED
TETRAHYDROCANNABINOL: NOT DETECTED

## 2016-06-17 LAB — TROPONIN I: Troponin I: 0.05 ng/mL (ref <0.03)

## 2016-06-17 MED ORDER — ACETAMINOPHEN 325 MG PO TABS: 650.0000 mg | ORAL_TABLET | Freq: Four times a day (QID) | ORAL | Status: DC | PRN

## 2016-06-17 MED ORDER — ACETAMINOPHEN 650 MG RE SUPP: 650.0000 mg | Freq: Four times a day (QID) | RECTAL | Status: DC | PRN

## 2016-06-17 MED ORDER — CLONIDINE HCL 0.1 MG PO TABS
0.2000 mg | ORAL_TABLET | Freq: Once | ORAL | Status: AC
  Administered 2016-06-17: 0.2 mg via ORAL
  Filled 2016-06-17: qty 2

## 2016-06-17 MED ORDER — LISINOPRIL 10 MG PO TABS
10.0000 mg | ORAL_TABLET | Freq: Two times a day (BID) | ORAL | Status: DC
  Administered 2016-06-17 – 2016-06-19 (×4): 10 mg via ORAL
  Filled 2016-06-17 (×4): qty 1

## 2016-06-17 MED ORDER — ENOXAPARIN SODIUM 60 MG/0.6ML ~~LOC~~ SOLN
60.0000 mg | Freq: Every day | SUBCUTANEOUS | Status: DC
  Administered 2016-06-18 – 2016-06-19 (×3): 60 mg via SUBCUTANEOUS
  Filled 2016-06-17 (×3): qty 0.6

## 2016-06-17 MED ORDER — AMLODIPINE BESYLATE 5 MG PO TABS
5.0000 mg | ORAL_TABLET | Freq: Two times a day (BID) | ORAL | Status: DC
  Administered 2016-06-18: 5 mg via ORAL
  Filled 2016-06-17: qty 1

## 2016-06-17 MED ORDER — SODIUM CHLORIDE 0.9% FLUSH: 3.0000 mL | INTRAVENOUS | Status: DC | PRN

## 2016-06-17 MED ORDER — SODIUM CHLORIDE 0.9 % IV SOLN: 250.0000 mL | INTRAVENOUS | Status: DC | PRN

## 2016-06-17 NOTE — ED Notes (Signed)
Patient transported to X-ray 

## 2016-06-17 NOTE — ED Triage Notes (Signed)
Patient has a hx of HTN and has been unable to obtain his medication. Patient states he last took his medications 2 years ago. Patient denies pain or any other symptoms. Patient was sent by PCP due to their machines being unable to read his blood pressure.

## 2016-06-17 NOTE — Progress Notes (Signed)
EDCM went to speak to patient at bedside, however doctor speaking to patient at bedside.

## 2016-06-17 NOTE — ED Provider Notes (Signed)
[facility]-EMERGENCY DEPT Provider Note   CSN: [id number] Arrival date & time: 06/17/16  1656     History   Chief Complaint Chief Complaint  Patient presents with  . Hypertension    HPI Dennis Beasley is a 40 y.o. male.  HPI Patient presents with hypertension. Sent in from primary care doctor who is seen for a work physical. Found to have blood pressure that would not register on their machine. History of hypertension but has been off his medicines for 2 years because he did not want to pay his co-pay for the office visits. Patient without complaints. No chest pain. No Trouble breathing. No headache. No confusion. No numbness or weakness. Denies drug use.    Past Medical History:  Diagnosis Date  . Hypercholesteremia   . Hypertension     There are no active problems to display for this patient.   History reviewed. No pertinent surgical history.     Home Medications    Prior to Admission medications   Medication Sig Start Date End Date Taking? Authorizing Provider  guaifenesin (ROBITUSSIN) 100 MG/5ML syrup Take 200 mg by mouth 3 (three) times daily as needed for cough or congestion.   Yes Historical Provider, MD  hydrochlorothiazide (HYDRODIURIL) 25 MG tablet Take 1 tablet (25 mg total) by mouth daily. Patient not taking: Reported on 06/17/2016 07/29/14   Mercedes Camprubi-Soms, PA-C    Family History No family history on file.  Social History Social History  Substance Use Topics  . Smoking status: Never Smoker  . Smokeless tobacco: Never Used  . Alcohol use No     Allergies   Patient has no known allergies.   Review of Systems Review of Systems  Constitutional: Negative for activity change and appetite change.  Eyes: Negative for pain.  Respiratory: Negative for chest tightness and shortness of breath.   Cardiovascular: Negative for chest pain and leg swelling.  Gastrointestinal: Negative for abdominal pain, diarrhea, nausea and vomiting.  Genitourinary:  Negative for flank pain.  Musculoskeletal: Negative for back pain and neck stiffness.  Skin: Negative for rash.  Neurological: Negative for weakness, numbness and headaches.  Psychiatric/Behavioral: Negative for behavioral problems.     Physical Exam Updated Vital Signs BP (!) 205/122   Pulse 93   Temp 97.8 F (36.6 C)   Resp 25   Ht 5\' 7"  (1.702 m)   Wt 268 lb (121.6 kg)   SpO2 100%   BMI 41.97 kg/m   Physical Exam  Constitutional: He is oriented to person, place, and time. He appears well-developed and well-nourished.  HENT:  Head: Normocephalic and atraumatic.  Neck: Neck supple.  Cardiovascular: Normal rate, regular rhythm and normal heart sounds.   No murmur heard. Pulmonary/Chest: Effort normal and breath sounds normal.  Abdominal: Soft. Bowel sounds are normal. He exhibits no distension and no mass. There is no tenderness. There is no rebound and no guarding.  Musculoskeletal: Normal range of motion. He exhibits no edema.  Neurological: He is alert and oriented to person, place, and time. No cranial nerve deficit.  Skin: Skin is warm and dry.  Psychiatric: He has a normal mood and affect.  Nursing note and vitals reviewed.    ED Treatments / Results  Labs (all labs ordered are listed, but only abnormal results are displayed) Labs Reviewed  URINALYSIS, ROUTINE W REFLEX MICROSCOPIC - Abnormal; Notable for the following:       Result Value   Protein, ur 30 (*)    All other components  within normal limits  TROPONIN I - Abnormal; Notable for the following:    Troponin I 0.05 (*)    All other components within normal limits  BASIC METABOLIC PANEL - Abnormal; Notable for the following:    Potassium 3.4 (*)    Creatinine, Ser 1.60 (*)    Calcium 8.6 (*)    GFR calc non Af Amer 53 (*)    All other components within normal limits  CBC WITH DIFFERENTIAL/PLATELET  RAPID URINE DRUG SCREEN, HOSP PERFORMED    EKG  EKG Interpretation  Date/Time:  Tuesday June 17 2016 17:21:19 EST Ventricular Rate:  93 PR Interval:    QRS Duration: 85 QT Interval:  390 QTC Calculation: 486 R Axis:   80 Text Interpretation:  Sinus rhythm Biatrial enlargement Probable left ventricular hypertrophy Borderline prolonged QT interval Confirmed by Rubin PayorPICKERING  MD, Harrold DonathNATHAN [phone] on 06/17/2016 5:29:45 PM       Radiology Dg Chest 2 View  Result Date: 06/17/2016 CLINICAL DATA:  Cough for 3 weeks.  Hypertension. EXAM: CHEST  2 VIEW COMPARISON:  None. FINDINGS: Borderline mild cardiomegaly. Mediastinal contours are normal. No pulmonary edema, focal airspace disease, pleural effusion or pneumothorax. No acute osseous abnormality is seen. IMPRESSION: Borderline mild cardiomegaly. No congestive failure or acute pulmonary process. Electronically Signed   By: Rubye OaksMelanie  Ehinger M.D.   On: 06/17/2016 18:18    Procedures Procedures (including critical care time)  Medications Ordered in ED Medications  cloNIDine (CATAPRES) tablet 0.2 mg (0.2 mg Oral Given 06/17/16 1843)     Initial Impression / Assessment and Plan / ED Course  I have reviewed the triage vital signs and the nursing notes.  Pertinent labs & imaging results that were available during my care of the patient were reviewed by me and considered in my medical decision making (see chart for details).  Clinical Course   Patient with hypertension. History of same and has not been taking his medicines. Patient cell complaints but found to have severe hypertension with elevated creatinine and minimally elevated troponin. It is point this could be acute end organ damage and will be admitted for further treatment. Discussed with Dr. Arlean HoppingSchertz   Final Clinical Impressions(s) / ED Diagnoses   Final diagnoses:  Hypertensive urgency  Renal insufficiency    New Prescriptions New Prescriptions   No medications on file     Benjiman CoreNathan Akina Maish, MD 06/17/16 2054

## 2016-06-17 NOTE — Progress Notes (Signed)
EDCM spoke to patient at bedside.  Patient confirms [facility].  EDCM unable to locate this provider.

## 2016-06-17 NOTE — ED Notes (Signed)
Call to Dr Melodie BouillonSchwertz order to give dose of BP medication obtained he will place the order.

## 2016-06-17 NOTE — ED Notes (Signed)
Checked blood pressure by the monitor and then rechecked the blood pressure manually.

## 2016-06-17 NOTE — H&P (Signed)
[facility] History and Physical  Dennis Beasley [id number]:[id number] DOB: 06-23-1976 DOA: 06/17/2016  Referring physician: Dr Alvino Chapel PCP: No primary care provider on file.   Chief Complaint: Uncontrolled HTN  HPI: Dennis Beasley is a 40 y.o. male with history of HTN for about 4 years, has taken BP medication off and on during this time, mostly off per patient.  Today he had a PCP appt and his provider couldn't get a reading on his BP, she told him to go to a local ED. Came to ED where BP's were 220-230/ 138- 168 initially.  Asymtpomatic, rec'd clonidine 0.25m one time and BP's are a little better at 204/ 139.  Asked to see for admission.    Pt denies any HA, focal weakness, CP or SOB.  No ankle swelling.  No nsaid use or etoh abuse.  No drug use.  His wife requests that we give him medication that doesn't cause impotence , he has had sig problems with prior BP meds causing impotence, they don't know the names of those medications other than the HCTZ which is listed as his only home med here, and which he's not taking.    No surgical history, no hospital stays.    [place] [employer] and did 1[place]2 mos at [employer]. 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 Hypercholesteremia   . Hypertension    Past Surgical History History reviewed. No pertinent surgical history. Family History No family history on file. Social History  reports that he has never smoked. He has never used  smokeless tobacco. He reports that he does not drink alcohol or use drugs. Allergies No Known Allergies Home medications Prior to Admission medications   Medication Sig Start Date End Date Taking? Authorizing Provider  guaifenesin (ROBITUSSIN) 100 MG/5ML syrup Take 200 mg by mouth 3 (three) times daily as needed for cough or congestion.   Yes Historical Provider, MD  hydrochlorothiazide (HYDRODIURIL) 25 MG tablet Take 1 tablet (25 mg total) by mouth daily. Patient not taking: Reported on 06/17/2016 07/29/14   Mercedes Camprubi-Soms, PA-C   Liver Function Tests No results for input(s): AST, ALT, ALKPHOS, BILITOT, PROT, ALBUMIN in the last 168 hours. No results for input(s): LIPASE, AMYLASE in the last 168 hours. CBC  Recent Labs Lab 06/17/16 1739  WBC 7.6  NEUTROABS 4.4  HGB 14.7  HCT 44.0  MCV 84.0  PLT 3408  Basic Metabolic Panel  Recent Labs Lab 06/17/16 1739  NA 140  K 3.4*  CL 103  CO2 30  GLUCOSE 92  BUN 20  CREATININE 1.60*  CALCIUM 8.6*     Vitals:   06/17/16 1841 06/17/16 1843 06/17/16 1900 06/17/16 2204  BP: (!) 224/138 (!) 224/168 (!) 205/122 (!) 204/139  Pulse: 92  93 90  Resp: (!) 27  25 18   Temp:    97.9 F (36.6 C)  TempSrc:  Oral  SpO2:    100%  Weight:      Height:       Exam: Gen pleasant, mod obese, AAM no distress, calm No rash, cyanosis or gangrene Sclera anicteric, throat clear  No jvd or bruits Chest clear bilat RRR no MRG Abd soft ntnd no mass or ascites +bs obese GU normal male MS no joint effusions or deformity Ext no LE edema / no wounds or ulcers Neuro is alert, Ox 3 , nf   Na 140  K 3.4  CO2 30  BUN 20  Cr 1.60   eGFR >60  WBC 7k Hb 14  UA 30 prot, o/w neg UDS negative  EKG (independ reviewed) > NSR, no ischemic changes, prob LVH , +biatrial enlargement CXR (independ reviewed) > no active disease, borderline CM   Assessment: 1. HTN'sive urgency - in pt w known HTN for several years, asymptomatic.  No hx nsiad's or  etoh abuse. Suspect essential HTN.  Pt very concerned about impotence as side effect, therefore will avoid HCTZ/ B-blocker/ clonidine for now. Plan low dose of ACEi + and CCB bid for now, titrate up.  Alpha-blocker also would be good if needed.  Goal BP no > 25-30% drop in the short-term ( keep > 165/ 95 approx) for now.   2. Renal insufficiency - may be due to #1. UA negative. Get renal US.   Plan - as above     Harbine D [facility] Pager [phone]   If 7PM-7AM, please contact night-coverage [email or web address] Password [email or web address] 06/17/2016, 10:07 PM

## 2016-06-17 NOTE — ED Notes (Signed)
Admitting floor contacted bed is a ready bed pt medicated for HTN 1st dose lisinopril 10 mg given.

## 2016-06-17 NOTE — ED Notes (Signed)
Primary RN made aware of patient's troponin 

## 2016-06-17 NOTE — ED Notes (Signed)
Bed: [id number] Expected date:  Expected time:  Means of arrival:  Comments: No bed monitor or nurse

## 2016-06-17 NOTE — Progress Notes (Signed)
Rx Brief note:  Lovenox  Wt=121 kg, CrCl~78 ml/min, BMI=41  Rx adjusted Lovenox to 60mg  daily (~0.5 mg/kg) in pt with BMI>30  Thanks Lorenza EvangelistGreen, Amberlie Gaillard R 06/17/2016 11:53 PM

## 2016-06-18 ENCOUNTER — Inpatient Hospital Stay (HOSPITAL_COMMUNITY): Payer: BC Managed Care – PPO

## 2016-06-18 DIAGNOSIS — I160 Hypertensive urgency: Principal | ICD-10-CM

## 2016-06-18 DIAGNOSIS — N179 Acute kidney failure, unspecified: Secondary | ICD-10-CM

## 2016-06-18 MED ORDER — CLONIDINE HCL 0.2 MG PO TABS
0.2000 mg | ORAL_TABLET | Freq: Two times a day (BID) | ORAL | Status: DC
  Administered 2016-06-18 – 2016-06-20 (×5): 0.2 mg via ORAL
  Filled 2016-06-18 (×5): qty 1

## 2016-06-18 MED ORDER — HYDRALAZINE HCL 20 MG/ML IJ SOLN: 10.0000 mg | Freq: Four times a day (QID) | INTRAMUSCULAR | Status: DC | PRN

## 2016-06-18 MED ORDER — POTASSIUM CHLORIDE CRYS ER 20 MEQ PO TBCR
40.0000 meq | EXTENDED_RELEASE_TABLET | Freq: Once | ORAL | Status: AC
  Administered 2016-06-18: 40 meq via ORAL
  Filled 2016-06-18: qty 2

## 2016-06-18 MED ORDER — AMLODIPINE BESYLATE 10 MG PO TABS
10.0000 mg | ORAL_TABLET | Freq: Two times a day (BID) | ORAL | Status: DC
  Administered 2016-06-18 – 2016-06-20 (×4): 10 mg via ORAL
  Filled 2016-06-18 (×5): qty 1

## 2016-06-18 NOTE — Progress Notes (Signed)
[facility] PROGRESS NOTE    Progress Note  Dennis Beasley  ZOX:[id number] DOB: [birth year] DOA: 06/17/2016 PCP: PROVIDER NOT IN SYSTEM     Brief Narrative:   Dennis Beasley is an 40 y.o. male past medical history of essential hypertension had been taking his medication intermittently went to his PCP who sent him to the ED due to his severely elevated blood pressure.  Assessment/Plan:   Active Problems:   Hypertensive urgency   Renal insufficiency   AKI (acute kidney injury) (HCC)  Has no histories of NSAIDs or ETOH abuse. Urine has no protein or white blood cells. UDS is negative. Renal ultrasound Chronic renal disease. Increase Norvasc, continue lisinopril, start him on clonidine twice a day and hydralazine when necessary.   DVT prophylaxis: Lovenox Family Communication:Wife Disposition Plan/Barrier to D/C: none Code Status:     Code Status Orders        Start     Ordered   06/17/16 2351  Full code  Continuous     06/17/16 2350    Code Status History    Date Active Date Inactive Code Status Order ID Comments User Context   This patient has a current code status but no historical code status.        IV Access:    Peripheral IV   Procedures and diagnostic studies:   Dg Chest 2 View  Result Date: 06/17/2016 CLINICAL DATA:  Cough for 3 weeks.  Hypertension. EXAM: CHEST  2 VIEW COMPARISON:  None. FINDINGS: Borderline mild cardiomegaly. Mediastinal contours are normal. No pulmonary edema, focal airspace disease, pleural effusion or pneumothorax. No acute osseous abnormality is seen. IMPRESSION: Borderline mild cardiomegaly. No congestive failure or acute pulmonary process. Electronically Signed   By: Rubye OaksMelanie  Ehinger M.D.   On: 06/17/2016 18:18     Medical Consultants:    None.  Anti-Infectives:   None  Subjective:    [name]   Objective:    Vitals:   06/17/16 2330 06/17/16 2349 06/18/16 0156 06/18/16 0513  BP: (!) 207/88 (!)  210/126 (!) 200/131 (!) 192/114  Pulse: 88 87 92 81  Resp: 21 18 18 18   Temp:  97.7 F (36.5 C) 97.7 F (36.5 C) 97.5 F (36.4 C)  TempSrc:  Oral Oral Oral  SpO2:  100% 99% 100%  Weight:  119.8 kg (264 lb 1.8 oz)    Height:  5\' 7"  (1.702 m)     No intake or output data in the 24 hours ending 06/18/16 0952 Filed Weights   06/17/16 1705 06/17/16 2349  Weight: 121.6 kg (268 lb) 119.8 kg (264 lb 1.8 oz)    Exam: General exam: In no acute distress. Respiratory system: Good air movement and clear to auscultation. Cardiovascular system: S1 & S2 heard, RRR. No JVD. Gastrointestinal system: Abdomen is nondistended, soft and nontender.  Extremities: No pedal edema. Skin: No rashes, lesions or ulcers Psychiatry: Judgement and insight appear normal. Mood & affect appropriate.    Data Reviewed:    Labs: Basic Metabolic Panel:  Recent Labs Lab 06/17/16 1739  NA 140  K 3.4*  CL 103  CO2 30  GLUCOSE 92  BUN 20  CREATININE 1.60*  CALCIUM 8.6*   GFR Estimated Creatinine Clearance: 77.6 mL/min (by C-G formula based on SCr of 1.6 mg/dL (H)). Liver Function Tests: No results for input(s): AST, ALT, ALKPHOS, BILITOT, PROT, ALBUMIN in the last 168 hours. No results for input(s): LIPASE, AMYLASE in the last 168 hours. No results for input(s):  AMMONIA in the last 168 hours. Coagulation profile No results for input(s): INR, PROTIME in the last 168 hours.  CBC:  Recent Labs Lab 06/17/16 1739  WBC 7.6  NEUTROABS 4.4  HGB 14.7  HCT 44.0  MCV 84.0  PLT 358   Cardiac Enzymes:  Recent Labs Lab 06/17/16 1739  TROPONINI 0.05*   BNP (last 3 results) No results for input(s): PROBNP in the last 8760 hours. CBG: No results for input(s): GLUCAP in the last 168 hours. D-Dimer: No results for input(s): DDIMER in the last 72 hours. Hgb A1c: No results for input(s): HGBA1C in the last 72 hours. Lipid Profile: No results for input(s): CHOL, HDL, LDLCALC, TRIG, CHOLHDL,  LDLDIRECT in the last 72 hours. Thyroid function studies: No results for input(s): TSH, T4TOTAL, T3FREE, THYROIDAB in the last 72 hours.  Invalid input(s): FREET3 Anemia work up: No results for input(s): VITAMINB12, FOLATE, FERRITIN, TIBC, IRON, RETICCTPCT in the last 72 hours. Sepsis Labs:  Recent Labs Lab 06/17/16 1739  WBC 7.6   Microbiology No results found for this or any previous visit (from the past 240 hour(s)).   Medications:   . amLODipine  5 mg Oral BID  . enoxaparin (LOVENOX) injection  60 mg Subcutaneous QHS  . lisinopril  10 mg Oral BID   Continuous Infusions:  Time spent: 25 min   LOS: 1 day   Marinda ElkFELIZ ORTIZ, Pavlos Yon  [facility] Pager [phone]  *Please refer to [email or web address], password [email or web address] to get updated schedule on who will round on this patient, as hospitalists switch teams weekly. If 7PM-7AM, please contact night-coverage at [email or web address], password [email or web address] for any overnight needs.  06/18/2016, 9:52 AM

## 2016-06-18 NOTE — Care Management Note (Signed)
Case Management Note  Patient Details  Name: Dennis Beasley MRN: [id number] Date of Birth: 1976-02-07  Subjective/Objective:40 y/o m admitted w/HTN urgency. From home w/family. Works, has Community education officerinsurance. Encourgaged patient to take meds since he has script coverage.Provided w/$4 [facility] med list.                    Action/Plan:d/c plan home.   Expected Discharge Date:   (unknown)               Expected Discharge Plan:  Home/Self Care  In-House Referral:     Discharge planning Services  CM Consult, Medication Assistance  Post Acute Care Choice:    Choice offered to:     DME Arranged:    DME Agency:     HH Arranged:    HH Agency:     Status of Service:  In process, will continue to follow  If discussed at Long Length of Stay Meetings, dates discussed:    Additional Comments:  Lanier ClamMahabir, Dennis Scharfenberg, RN 06/18/2016, 2:18 PM

## 2016-06-18 NOTE — Plan of Care (Signed)
Problem: Safety: Goal: Ability to remain free from injury will improve Outcome: Completed/Met Date Met: 06/18/16 Pt has a steady gait

## 2016-06-19 ENCOUNTER — Inpatient Hospital Stay (HOSPITAL_COMMUNITY): Payer: BC Managed Care – PPO

## 2016-06-19 DIAGNOSIS — I5042 Chronic combined systolic (congestive) and diastolic (congestive) heart failure: Secondary | ICD-10-CM

## 2016-06-19 DIAGNOSIS — I425 Other restrictive cardiomyopathy: Secondary | ICD-10-CM

## 2016-06-19 LAB — BASIC METABOLIC PANEL
Anion gap: 7 (ref 5–15)
BUN: 18 mg/dL (ref 6–20)
CALCIUM: 8 mg/dL — AB (ref 8.9–10.3)
CO2: 27 mmol/L (ref 22–32)
CREATININE: 1.31 mg/dL — AB (ref 0.61–1.24)
Chloride: 104 mmol/L (ref 101–111)
GFR calc non Af Amer: >60 mL/min (ref >60)
Glucose, Bld: 111 mg/dL — ABNORMAL HIGH (ref 65–99)
Potassium: 3.8 mmol/L (ref 3.5–5.1)
SODIUM: 138 mmol/L (ref 135–145)

## 2016-06-19 LAB — ECHOCARDIOGRAM COMPLETE
HEIGHTINCHES: 67 in
WEIGHTICAEL: 4225.78 [oz_av]

## 2016-06-19 MED ORDER — AMLODIPINE BESYLATE 10 MG PO TABS: 10.0000 mg | ORAL_TABLET | Freq: Every day | ORAL | 0 refills | Status: DC

## 2016-06-19 MED ORDER — LISINOPRIL 20 MG PO TABS
20.0000 mg | ORAL_TABLET | Freq: Two times a day (BID) | ORAL | Status: DC
  Administered 2016-06-20: 20 mg via ORAL
  Filled 2016-06-19 (×2): qty 1

## 2016-06-19 MED ORDER — HYDROCHLOROTHIAZIDE 12.5 MG PO CAPS
12.5000 mg | ORAL_CAPSULE | Freq: Every day | ORAL | Status: DC
Start: 2016-06-19 — End: 2016-06-20
  Administered 2016-06-19 – 2016-06-20 (×2): 12.5 mg via ORAL
  Filled 2016-06-19 (×2): qty 1

## 2016-06-19 MED ORDER — LISINOPRIL 40 MG PO TABS
40.0000 mg | ORAL_TABLET | Freq: Every day | ORAL | 0 refills | Status: DC
Start: 2016-06-19 — End: 2016-06-20

## 2016-06-19 MED ORDER — CARVEDILOL 6.25 MG PO TABS: 6.2500 mg | ORAL_TABLET | Freq: Two times a day (BID) | ORAL | 3 refills | Status: DC

## 2016-06-19 MED ORDER — LISINOPRIL 10 MG PO TABS
10.0000 mg | ORAL_TABLET | Freq: Once | ORAL | Status: AC
  Administered 2016-06-19: 10 mg via ORAL
  Filled 2016-06-19: qty 1

## 2016-06-19 NOTE — Discharge Summary (Signed)
Physician Discharge Summary  Dennis Beasley [id number] DOB: Nov 02, 1975 DOA: 06/17/2016  PCP: PROVIDER NOT IN SYSTEM  Admit date: 06/17/2016 Discharge date: 06/19/2016  Admitted From: Home Disposition:  Home  Recommendations for Outpatient Follow-up:  1. Follow up with PCP in 1-2 weeks 2. Please obtain BMP/CBC in one week   Home Health:No Equipment/Devices:None  Discharge Condition:stable CODE STATUS:full Diet recommendation: Heart Healthy   Brief/Interim Summary: 40 year old past medical history of hypertension for about 40 years noncompliant with his medication comes in with elevated blood pressure.  Discharge Diagnoses:  Active Problems:   Hypertensive urgency   AKI (acute kidney injury) (HCC)  He denies any NSAIDs or alcohol use, his UDS was negative his UA showed no protein or white blood cells. He was started on Norvasc, lisinopril, Coreg and hydrochlorothiazide his blood pressure came down to the ones 140s 160s nicely. Ultrasound was done initial chronic renal disease. 2-D echo was done that shows Grade 3 diastolic heart failure with an EF of 45%.   Discharge Instructions     Medication List    TAKE these medications   amLODipine 10 MG tablet Commonly known as:  NORVASC Take 1 tablet (10 mg total) by mouth daily.   carvedilol 6.25 MG tablet Commonly known as:  COREG Take 1 tablet (6.25 mg total) by mouth 2 (two) times daily with a meal.   guaifenesin 100 MG/5ML syrup Commonly known as:  ROBITUSSIN Take 200 mg by mouth 3 (three) times daily as needed for cough or congestion.   hydrochlorothiazide 25 MG tablet Commonly known as:  HYDRODIURIL Take 1 tablet (25 mg total) by mouth daily.   lisinopril 40 MG tablet Commonly known as:  PRINIVIL,ZESTRIL Take 1 tablet (40 mg total) by mouth daily.       No Known Allergies  Consultations: None  Procedures/Studies: Dg Chest 2 View  Result Date: 06/17/2016 CLINICAL DATA:  Cough for 3 weeks.   Hypertension. EXAM: CHEST  2 VIEW COMPARISON:  None. FINDINGS: Borderline mild cardiomegaly. Mediastinal contours are normal. No pulmonary edema, focal airspace disease, pleural effusion or pneumothorax. No acute osseous abnormality is seen. IMPRESSION: Borderline mild cardiomegaly. No congestive failure or acute pulmonary process. Electronically Signed   By: Rubye OaksMelanie  Ehinger M.D.   On: 06/17/2016 18:18   [place] Renal  Result Date: 06/18/2016 CLINICAL DATA:  Renal insufficiency, hypertension EXAM: RENAL / URINARY TRACT ULTRASOUND COMPLETE COMPARISON:  None. FINDINGS: Right Kidney: Length: 11.3 cm. No hydronephrosis is seen. However the echogenicity of the renal parenchyma appears increased consistent with chronic renal medical disease. Left Kidney: Length: 11.8 cm. No hydronephrosis is seen. The left renal parenchymal also appears echogenic. Bladder: The urinary bladder is moderately urine distended with no abnormality noted. IMPRESSION: 1. No hydronephrosis. 2. Echogenic renal parenchyma consistent with chronic renal medical disease. Electronically Signed   By: Dwyane DeePaul  Barry M.D.   On: 06/18/2016 10:35    Echo grade 3 diastolic heart failure with an EF of 40%.  Subjective: no complains feels great  Discharge Exam: Vitals:   06/19/16 0944 06/19/16 1211  BP: (!) 176/96 (!) 149/109  Pulse: 85 80  Resp:    Temp:     Vitals:   06/18/16 2010 06/19/16 0500 06/19/16 0944 06/19/16 1211  BP: (!) 178/117 (!) 163/109 (!) 176/96 (!) 149/109  Pulse: 90 78 85 80  Resp: (!) 24 20    Temp: 97.5 F (36.4 C) 97.4 F (36.3 C)    TempSrc: Oral Oral    SpO2: 100% 100%  Weight:      Height:        General: Pt is alert, awake, not in acute distress Cardiovascular: RRR, S1/S2 +, no rubs, no gallops Respiratory: CTA bilaterally, no wheezing, no rhonchi Abdominal: Soft, NT, ND, bowel sounds + Extremities: no edema, no cyanosis    The results of significant diagnostics from this hospitalization (including  imaging, microbiology, ancillary and laboratory) are listed below for reference.     Microbiology: No results found for this or any previous visit (from the past 240 hour(s)).   Labs: BNP (last 3 results) No results for input(s): BNP in the last 8760 hours. Basic Metabolic Panel:  Recent Labs Lab 06/17/16 1739 06/19/16 0432  NA 140 138  K 3.4* 3.8  CL 103 104  CO2 30 27  GLUCOSE 92 111*  BUN 20 18  CREATININE 1.60* 1.31*  CALCIUM 8.6* 8.0*   Liver Function Tests: No results for input(s): AST, ALT, ALKPHOS, BILITOT, PROT, ALBUMIN in the last 168 hours. No results for input(s): LIPASE, AMYLASE in the last 168 hours. No results for input(s): AMMONIA in the last 168 hours. CBC:  Recent Labs Lab 06/17/16 1739  WBC 7.6  NEUTROABS 4.4  HGB 14.7  HCT 44.0  MCV 84.0  PLT 358   Cardiac Enzymes:  Recent Labs Lab 06/17/16 1739  TROPONINI 0.05*   BNP: Invalid input(s): POCBNP CBG: No results for input(s): GLUCAP in the last 168 hours. D-Dimer No results for input(s): DDIMER in the last 72 hours. Hgb A1c No results for input(s): HGBA1C in the last 72 hours. Lipid Profile No results for input(s): CHOL, HDL, LDLCALC, TRIG, CHOLHDL, LDLDIRECT in the last 72 hours. Thyroid function studies No results for input(s): TSH, T4TOTAL, T3FREE, THYROIDAB in the last 72 hours.  Invalid input(s): FREET3 Anemia work up No results for input(s): VITAMINB12, FOLATE, FERRITIN, TIBC, IRON, RETICCTPCT in the last 72 hours. Urinalysis    Component Value Date/Time   COLORURINE YELLOW 06/17/2016 1730   APPEARANCEUR CLEAR 06/17/2016 1730   LABSPEC 1.019 06/17/2016 1730   PHURINE 5.0 06/17/2016 1730   GLUCOSEU NEGATIVE 06/17/2016 1730   HGBUR NEGATIVE 06/17/2016 1730   BILIRUBINUR NEGATIVE 06/17/2016 1730   KETONESUR NEGATIVE 06/17/2016 1730   PROTEINUR 30 (A) 06/17/2016 1730   NITRITE NEGATIVE 06/17/2016 1730   LEUKOCYTESUR NEGATIVE 06/17/2016 1730   Sepsis Labs Invalid  input(s): PROCALCITONIN,  WBC,  LACTICIDVEN Microbiology No results found for this or any previous visit (from the past 240 hour(s)).   Time coordinating discharge: Over 30 minutes  SIGNED:   Marinda ElkFELIZ ORTIZ, Lindberg Zenon, MD  [facility] 06/19/2016, 12:45 PM Pager   If 7PM-7AM, please contact night-coverage [email or web address] Password [email or web address]

## 2016-06-19 NOTE — Progress Notes (Signed)
  Echocardiogram 2D Echocardiogram has been performed.  Nolon RodBrown, Tony 06/19/2016, 12:20 PM

## 2016-06-19 NOTE — Care Management Note (Signed)
Case Management Note  Patient Details  Name: Dennis Beasley MRN: [id number] Date of Birth: 1975-11-16  Subjective/Objective:    No further CM needs.                Action/Plan:d/c home.   Expected Discharge Date:   (unknown)               Expected Discharge Plan:  Home/Self Care  In-House Referral:     Discharge planning Services  CM Consult, Medication Assistance  Post Acute Care Choice:    Choice offered to:     DME Arranged:    DME Agency:     HH Arranged:    HH Agency:     Status of Service:  Completed, signed off  If discussed at MicrosoftLong Length of Stay Meetings, dates discussed:    Additional Comments:  Dennis Beasley, Dennis Levingston, RN 06/19/2016, 10:51 AM

## 2016-06-20 DIAGNOSIS — I5042 Chronic combined systolic (congestive) and diastolic (congestive) heart failure: Secondary | ICD-10-CM

## 2016-06-20 LAB — BASIC METABOLIC PANEL
Anion gap: 6 (ref 5–15)
BUN: 17 mg/dL (ref 6–20)
CHLORIDE: 102 mmol/L (ref 101–111)
CO2: 29 mmol/L (ref 22–32)
CREATININE: 1.41 mg/dL — AB (ref 0.61–1.24)
Calcium: 8.5 mg/dL — ABNORMAL LOW (ref 8.9–10.3)
GFR calc Af Amer: >60 mL/min (ref >60)
GFR calc non Af Amer: >60 mL/min (ref >60)
Glucose, Bld: 105 mg/dL — ABNORMAL HIGH (ref 65–99)
Potassium: 3.8 mmol/L (ref 3.5–5.1)
Sodium: 137 mmol/L (ref 135–145)

## 2016-06-20 MED ORDER — CARVEDILOL 6.25 MG PO TABS: 6.2500 mg | ORAL_TABLET | Freq: Two times a day (BID) | ORAL | Status: DC

## 2016-06-20 MED ORDER — HYDROCHLOROTHIAZIDE 25 MG PO TABS: 25.0000 mg | ORAL_TABLET | Freq: Every day | ORAL | 1 refills | Status: AC

## 2016-06-20 MED ORDER — AMLODIPINE BESYLATE 10 MG PO TABS: 10.0000 mg | ORAL_TABLET | Freq: Every day | ORAL | 0 refills | Status: AC

## 2016-06-20 MED ORDER — CARVEDILOL 6.25 MG PO TABS: 6.2500 mg | ORAL_TABLET | Freq: Two times a day (BID) | ORAL | 3 refills | Status: AC

## 2016-06-20 MED ORDER — LISINOPRIL 40 MG PO TABS: 40.0000 mg | ORAL_TABLET | Freq: Every day | ORAL | 0 refills | Status: AC

## 2016-07-23 DIAGNOSIS — I10 Essential (primary) hypertension: Secondary | ICD-10-CM | POA: Diagnosis not present

## 2016-07-23 DIAGNOSIS — I129 Hypertensive chronic kidney disease with stage 1 through stage 4 chronic kidney disease, or unspecified chronic kidney disease: Secondary | ICD-10-CM | POA: Diagnosis not present

## 2016-08-27 DIAGNOSIS — Z111 Encounter for screening for respiratory tuberculosis: Secondary | ICD-10-CM | POA: Diagnosis not present

## 2016-08-27 DIAGNOSIS — I129 Hypertensive chronic kidney disease with stage 1 through stage 4 chronic kidney disease, or unspecified chronic kidney disease: Secondary | ICD-10-CM | POA: Diagnosis not present

## 2016-08-27 DIAGNOSIS — I10 Essential (primary) hypertension: Secondary | ICD-10-CM | POA: Diagnosis not present

## 2016-10-08 DIAGNOSIS — M25562 Pain in left knee: Secondary | ICD-10-CM | POA: Diagnosis not present

## 2016-10-08 DIAGNOSIS — M7989 Other specified soft tissue disorders: Secondary | ICD-10-CM | POA: Diagnosis not present

## 2016-12-22 DIAGNOSIS — Z23 Encounter for immunization: Secondary | ICD-10-CM | POA: Diagnosis not present

## 2016-12-22 DIAGNOSIS — I10 Essential (primary) hypertension: Secondary | ICD-10-CM | POA: Diagnosis not present

## 2016-12-22 DIAGNOSIS — I129 Hypertensive chronic kidney disease with stage 1 through stage 4 chronic kidney disease, or unspecified chronic kidney disease: Secondary | ICD-10-CM | POA: Diagnosis not present

## 2017-01-09 DIAGNOSIS — I10 Essential (primary) hypertension: Secondary | ICD-10-CM | POA: Diagnosis not present

## 2017-01-09 DIAGNOSIS — L729 Follicular cyst of the skin and subcutaneous tissue, unspecified: Secondary | ICD-10-CM | POA: Diagnosis not present

## 2017-01-16 DIAGNOSIS — I10 Essential (primary) hypertension: Secondary | ICD-10-CM | POA: Diagnosis not present

## 2017-01-16 DIAGNOSIS — N179 Acute kidney failure, unspecified: Secondary | ICD-10-CM | POA: Diagnosis not present

## 2017-01-23 DIAGNOSIS — L720 Epidermal cyst: Secondary | ICD-10-CM | POA: Diagnosis not present

## 2017-02-03 DIAGNOSIS — N179 Acute kidney failure, unspecified: Secondary | ICD-10-CM | POA: Diagnosis not present

## 2017-02-03 DIAGNOSIS — I10 Essential (primary) hypertension: Secondary | ICD-10-CM | POA: Diagnosis not present

## 2017-02-19 DIAGNOSIS — Z6841 Body Mass Index (BMI) 40.0 and over, adult: Secondary | ICD-10-CM | POA: Diagnosis not present

## 2017-02-19 DIAGNOSIS — I10 Essential (primary) hypertension: Secondary | ICD-10-CM | POA: Diagnosis not present

## 2017-04-22 IMAGING — CR DG CHEST 2V
2 series · 2 of 2 positions shown · non-contrast
Comparison: None.

CLINICAL DATA: Cough for 3 weeks.  Hypertension.

EXAM:
CHEST  2 VIEW

[w chest pa]
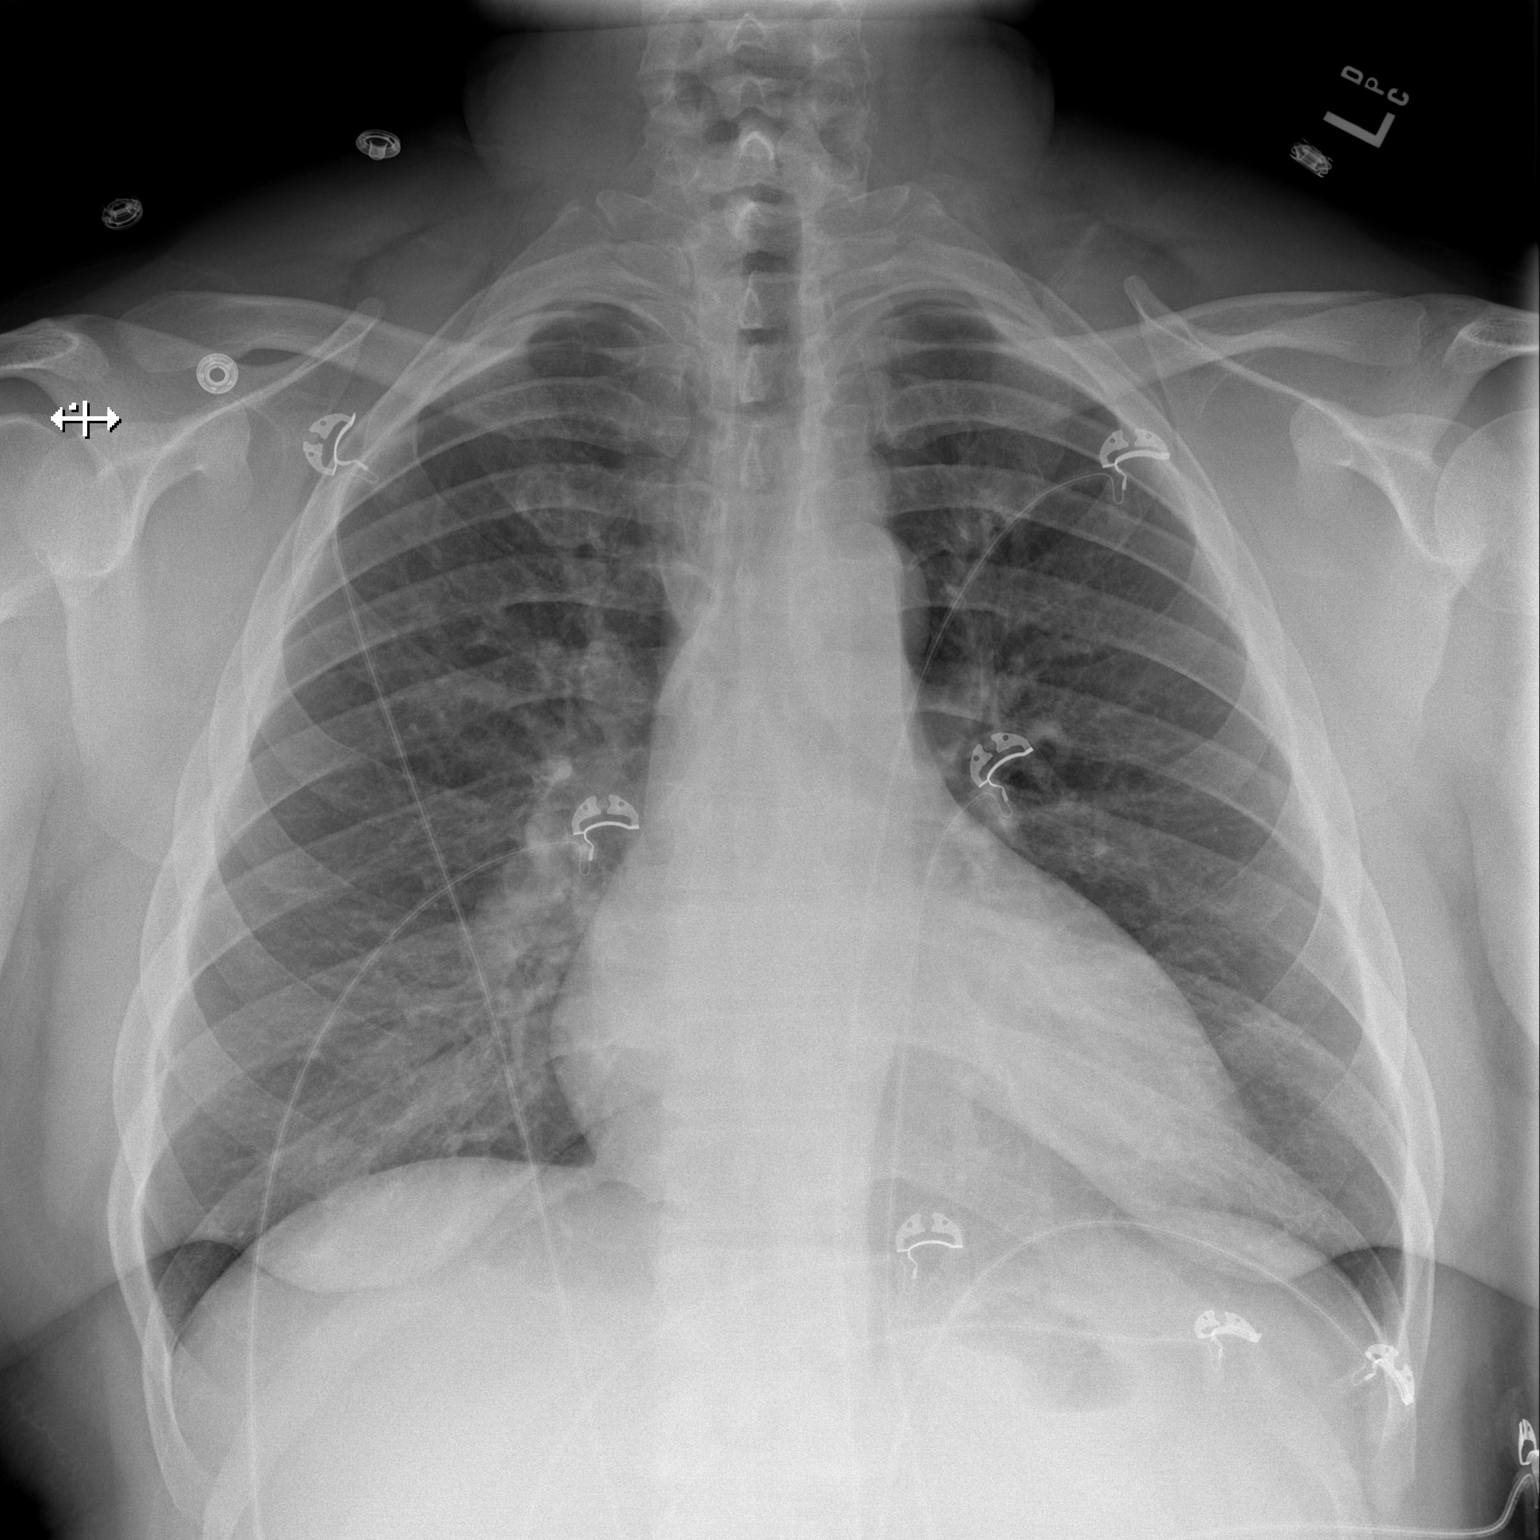

[w chest lat]
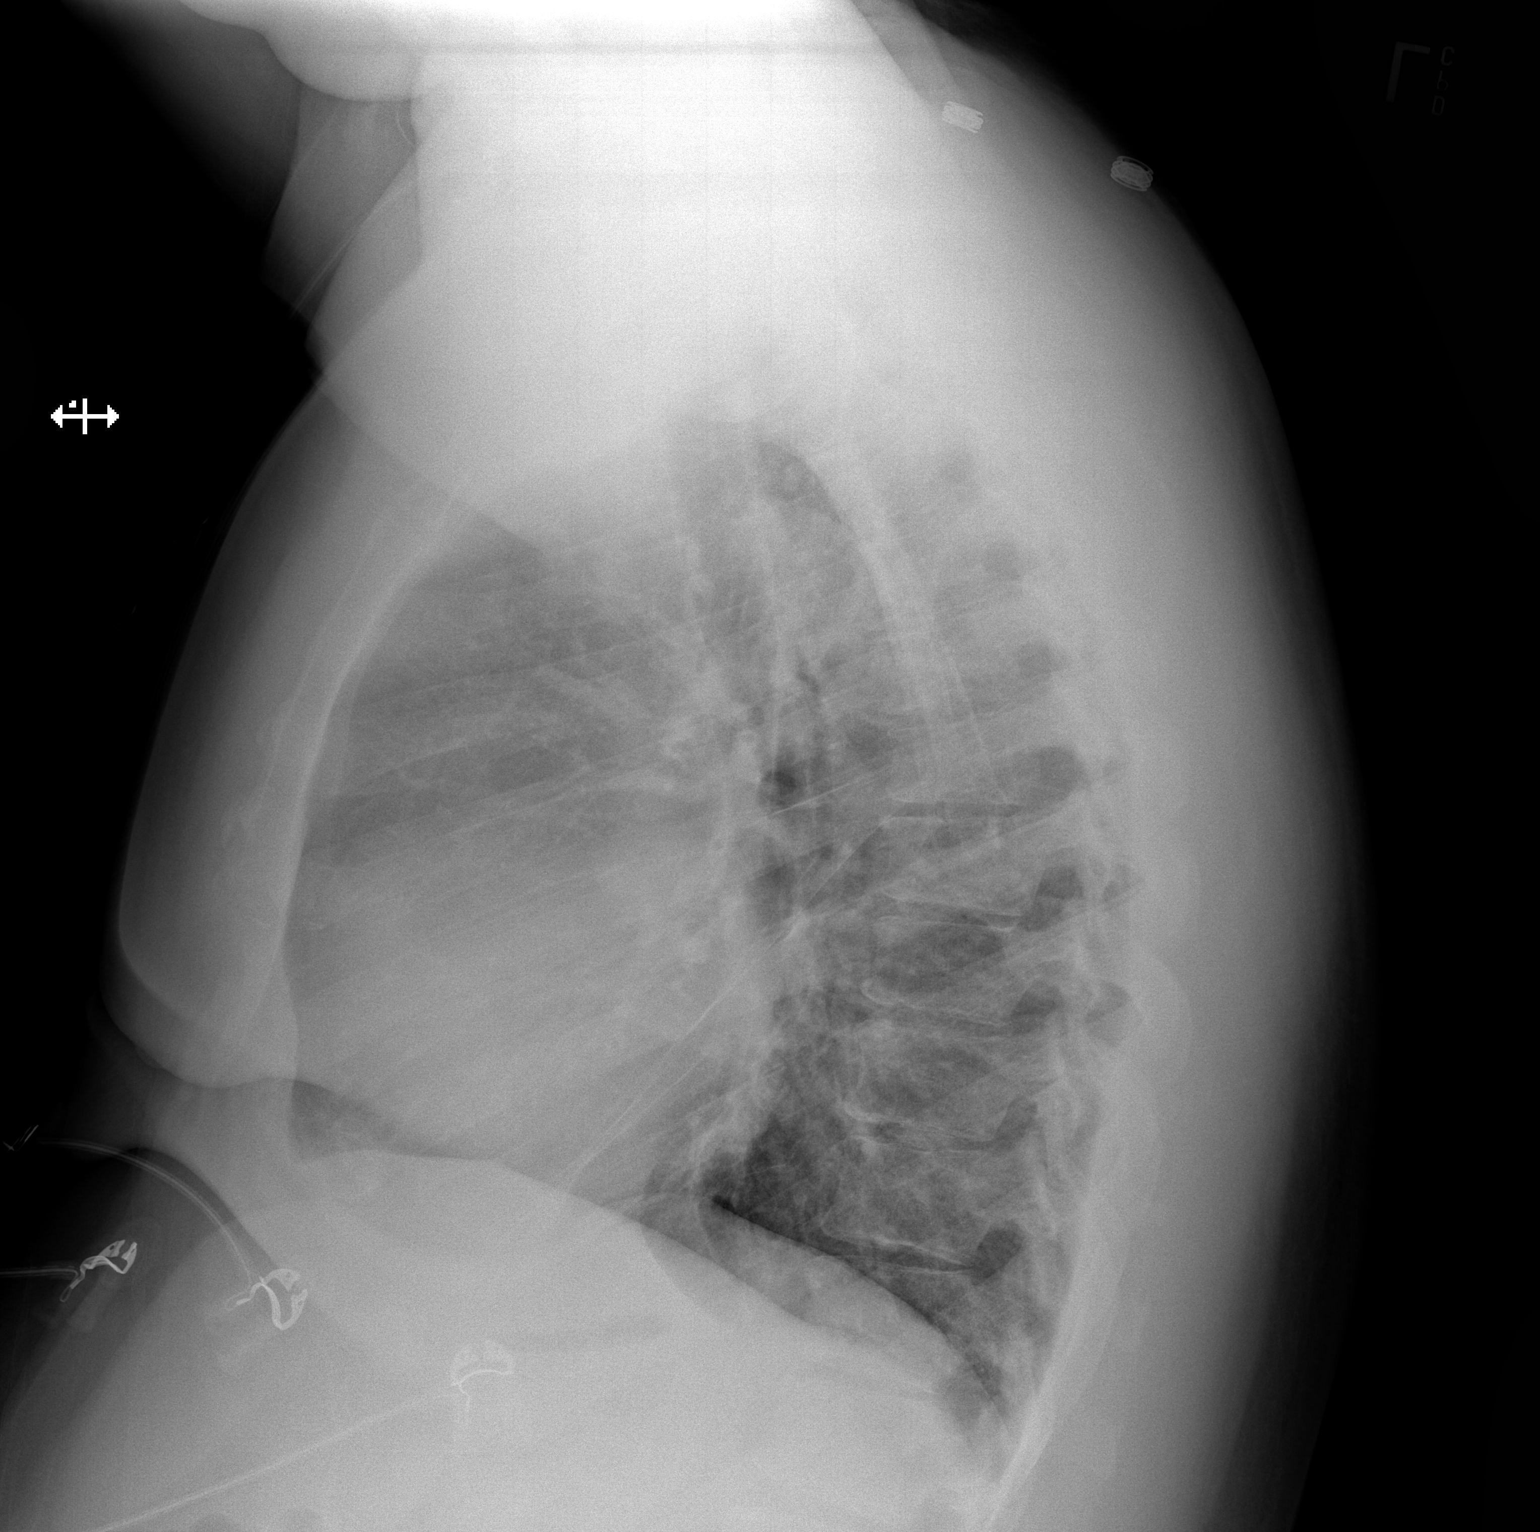

[2 of 2 positions shown; findings below may reference images not displayed]

FINDINGS: Borderline mild cardiomegaly. Mediastinal contours are normal. No
pulmonary edema, focal airspace disease, pleural effusion or
pneumothorax. No acute osseous abnormality is seen.
IMPRESSION: Borderline mild cardiomegaly. No congestive failure or acute
pulmonary process.

## 2017-05-04 DIAGNOSIS — I10 Essential (primary) hypertension: Secondary | ICD-10-CM | POA: Diagnosis not present

## 2017-05-04 DIAGNOSIS — Z6841 Body Mass Index (BMI) 40.0 and over, adult: Secondary | ICD-10-CM | POA: Diagnosis not present

## 2017-10-09 DIAGNOSIS — L720 Epidermal cyst: Secondary | ICD-10-CM | POA: Diagnosis not present

## 2017-12-22 DIAGNOSIS — H35033 Hypertensive retinopathy, bilateral: Secondary | ICD-10-CM | POA: Diagnosis not present

## 2017-12-22 DIAGNOSIS — I129 Hypertensive chronic kidney disease with stage 1 through stage 4 chronic kidney disease, or unspecified chronic kidney disease: Secondary | ICD-10-CM | POA: Diagnosis not present

## 2017-12-22 DIAGNOSIS — I10 Essential (primary) hypertension: Secondary | ICD-10-CM | POA: Diagnosis not present

## 2017-12-22 DIAGNOSIS — R739 Hyperglycemia, unspecified: Secondary | ICD-10-CM | POA: Diagnosis not present

## 2017-12-24 NOTE — Progress Notes (Addendum)
[facility] - Clinic Note  12/25/2017     CHIEF COMPLAINT Patient presents for Retina Evaluation and Diabetic Eye Exam   HISTORY OF PRESENT ILLNESS: Dennis Beasley is a 42 y.o. male who presents to the clinic today for:   HPI    Retina Evaluation    In both eyes.  This started 4 days ago.  Associated Symptoms Negative for Blind Spot, Glare, Shoulder/Hip pain, Fatigue, Jaw Claudication, Photophobia, Distortion, Floaters, Redness, Scalp Tenderness, Weight Loss, Fever, Trauma, Pain and Flashes.  Context:  distance vision, mid-range vision and near vision.  Treatments tried include no treatments.  I, the attending physician,  performed the HPI with the patient and updated documentation appropriately.          Diabetic Eye Exam    Vision is stable.  Associated Symptoms Negative for Flashes, Pain, Trauma, Fever, Floaters, Redness, Scalp Tenderness, Weight Loss, Fatigue, Jaw Claudication, Photophobia, Distortion, Blind Spot, Glare and Shoulder/Hip pain.  Diabetes characteristics include Type 2 and controlled with diet.  This started 4 days ago.  I, the attending physician,  performed the HPI with the patient and updated documentation appropriately.          Comments    Referral of DR. Delena ServeWharton, For DME. Patient state he had ov with DR. Wharton who told him he was Pre Diabetic, he was unaware of Bs issue. Denies visual changes ,the patient states he has noticed in the last few days his vision has became blurry. Pt reports he has begun to watch his diet to try to control his bs.Pt does not know last Bs/A1C results  He is on Bp medications, BP 151/109 x 2 this am. Pt reports he has not took HTCZ for 1-2 weeks due to being out of medication. Denies vit's/gtt's       Last edited by Rennis ChrisZamora, Rima Blizzard, MD on 12/25/2017 11:23 AM. (History)    Pt states he was sent here from Dr. Delena ServeWharton for blurred VA;   Referring physician: Jarrett SohoWharton, Courtney, PA-C [address] [place], [place] [id number]  HISTORICAL INFORMATION:   Selected notes from the MEDICAL RECORD NUMBER Referred by Dr. Delena ServeWharton for DM exam LEE-  Ocular Hx-  PMH- DM, HTN    CURRENT MEDICATIONS: No current outpatient medications on file. (Ophthalmic Drugs)   No current facility-administered medications for this visit.  (Ophthalmic Drugs)   Current Outpatient Medications (Other)  Medication Sig  . amLODipine (NORVASC) 10 MG tablet Take 1 tablet (10 mg total) by mouth daily.  . carvedilol (COREG) 6.25 MG tablet Take 1 tablet (6.25 mg total) by mouth 2 (two) times daily with a meal.  . chlorthalidone (HYGROTON) 25 MG tablet Take 25 mg by mouth daily.  Marland Kitchen. guaifenesin (ROBITUSSIN) 100 MG/5ML syrup Take 200 mg by mouth 3 (three) times daily as needed for cough or congestion.  Marland Kitchen. spironolactone (ALDACTONE) 25 MG tablet Take 25 mg by mouth daily.  . hydrochlorothiazide (HYDRODIURIL) 25 MG tablet Take 1 tablet (25 mg total) by mouth daily. (Patient not taking: Reported on 12/25/2017)  . lisinopril (PRINIVIL,ZESTRIL) 40 MG tablet Take 1 tablet (40 mg total) by mouth daily. (Patient not taking: Reported on 12/25/2017)   No current facility-administered medications for this visit.  (Other)      REVIEW OF SYSTEMS: ROS    Positive for: Endocrine, Eyes   Negative for: Constitutional, Gastrointestinal, Neurological, Skin, Genitourinary, Musculoskeletal, HENT, Cardiovascular, Respiratory, Psychiatric, Allergic/Imm, Heme/Lymph   Last edited by Eldridge ScotKendrick, Glenda, LPN on  12/25/2017 10:20 AM. (History)       ALLERGIES No Known Allergies  PAST MEDICAL HISTORY Past Medical History:  Diagnosis Date  . Hypercholesteremia   . Hypertension    History reviewed. No pertinent surgical history.  FAMILY HISTORY Family History  Problem Relation Age of Onset  . Diabetes Father     SOCIAL HISTORY Social History   Tobacco Use  . Smoking status: Never Smoker  . Smokeless tobacco: Never Used  Substance Use  Topics  . Alcohol use: No  . Drug use: No         OPHTHALMIC EXAM:  Base Eye Exam    Visual Acuity (Snellen - Linear)      Right Left   Dist [place] 20/40 +2 20/40 +2   Dist ph [place] 20/20 -2 20/20 -2       Tonometry (Tonopen, 10:37 AM)      Right Left   Pressure 11 12       Pupils      Dark Light Shape React APD   Right 4 2 Round Brisk None   Left 4 2 Round Brisk None       Visual Fields (Counting fingers)      Left Right    Full Full       Extraocular Movement      Right Left    Full, Ortho Full, Ortho       Neuro/Psych    Oriented x3:  Yes   Mood/Affect:  Normal       Dilation    Both eyes:  1.0% Mydriacyl, 2.5% Phenylephrine @ 10:37 AM        Slit Lamp and Fundus Exam    Slit Lamp Exam      Right Left   Lids/Lashes Dermatochalasis - upper lid Dermatochalasis - upper lid   Conjunctiva/Sclera White and quiet White and quiet   Cornea Inferior 2-3+ Punctate epithelial erosions, mild Neovascularization at 0400 3+ Punctate epithelial erosions, Superior and inferior Neovascularization   Anterior Chamber Deep and quiet Deep and quiet   Iris Round and dilated Round and dilated   Lens 1+ Nuclear sclerosis, 1+ Cortical cataract 1+ Nuclear sclerosis, 1+ Cortical cataract   Vitreous Vitreous syneresis Vitreous syneresis       Fundus Exam      Right Left   Disc Pink and Sharp Pink and Sharp   C/D Ratio 0.35 0.35   Macula Good foveal reflex, No heme or edema Good foveal reflex, No heme or edema   Vessels Tortuous, Copper wiring, AV crossing changes Tortuous, Copper wiring, AV crossing changes   Periphery Attached Attached          IMAGING AND PROCEDURES  Imaging and Procedures for @TODAY @  OCT, Retina - OU - Both Eyes       Right Eye Quality was good. Central Foveal Thickness: 267. Progression has no prior data. Findings include normal foveal contour, no IRF, no SRF.   Left Eye Quality was good. Central Foveal Thickness: 281. Progression has no prior  data. Findings include normal foveal contour, no IRF, no SRF.   Notes *Images captured and stored on drive  Diagnosis / Impression:  NFP, No IRF/SRF OU  Clinical management:  See below  Abbreviations: NFP - Normal foveal profile. CME - cystoid macular edema. PED - pigment epithelial detachment. IRF - intraretinal fluid. SRF - subretinal fluid. EZ - ellipsoid zone. ERM - epiretinal membrane. ORA - outer retinal atrophy. ORT - outer retinal tubulation. SRHM -  subretinal hyper-reflective material                  ASSESSMENT/PLAN:    ICD-10-CM   1. Essential hypertension I10   2. Hypertensive retinopathy of both eyes H35.033   3. Diabetes mellitus type 2 without retinopathy (HCC) E11.9   4. Retinal edema H35.81 OCT, Retina - OU - Both Eyes  5. Dry eyes, bilateral H04.123   6. 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 Combined forms of age-related cataract of both eyes H25.813     1,2. Hypertensive retinopathy OU-  - discussed importance of tight BP control - monitor - F/U PRN  3,4. No retinal edema on exam or OCT - no history of DM per pt and chart review  5. DES OU-  - recommend artificial tears QID - monitor  6. Refractive Error OU-  - BCVA 20/20 OU from 20/40 [place] - likely benefit from specs -- pt to see his Optometrist  7. Combined form age-related cataract OU-  - The symptoms of cataract, surgical options, and treatments and risks were discussed with patient. - discussed diagnosis and progression - not yet visually significant - monitor for now   Ophthalmic Meds Ordered this visit:  No orders of the defined types were placed in this encounter.      Return if symptoms worsen or fail to improve.  There are no Patient Instructions on file for this visit.   Explained the diagnoses, plan, and follow up with the patient and they expressed understanding.  Patient expressed understanding of the importance of proper follow up care.   This document serves as a record of  services personally performed by Karie Chimera, MD, PhD. It was created on their behalf by Virgilio Belling, COA, a certified ophthalmic assistant. The creation of this record is the provider's dictation and/or activities during the visit.  Electronically signed by: Virgilio Belling, COA  06.13.19 10:20 PM   Karie Chimera, M.D., Ph.D. Diseases & Surgery of the Retina and Vitreous [facility]   I have reviewed the above documentation for accuracy and completeness, and I agree with the above. Karie Chimera, M.D., Ph.D. 12/28/17 10:20 PM    Abbreviations: M myopia (nearsighted); A astigmatism; H hyperopia (farsighted); P presbyopia; Mrx spectacle prescription;  CTL contact lenses; OD right eye; OS left eye; OU both eyes  XT exotropia; ET esotropia; PEK punctate epithelial keratitis; PEE punctate epithelial erosions; DES dry eye syndrome; MGD meibomian gland dysfunction; ATs artificial tears; PFAT's preservative free artificial tears; NSC nuclear sclerotic cataract; PSC posterior subcapsular cataract; ERM epi-retinal membrane; PVD posterior vitreous detachment; RD retinal detachment; DM diabetes mellitus; DR diabetic retinopathy; NPDR non-proliferative diabetic retinopathy; PDR proliferative diabetic retinopathy; CSME clinically significant macular edema; DME diabetic macular edema; dbh dot blot hemorrhages; CWS cotton wool spot; POAG primary open angle glaucoma; C/D cup-to-disc ratio; HVF humphrey visual field; GVF goldmann visual field; OCT optical coherence tomography; IOP intraocular pressure; BRVO Branch retinal vein occlusion; CRVO central retinal vein occlusion; CRAO central retinal artery occlusion; BRAO branch retinal artery occlusion; RT retinal tear; SB scleral buckle; PPV pars plana vitrectomy; VH Vitreous hemorrhage; PRP panretinal laser photocoagulation; IVK intravitreal kenalog; VMT vitreomacular traction; MH Macular hole;  NVD neovascularization of the disc; NVE  neovascularization elsewhere; AREDS age related eye disease study; ARMD age related macular degeneration; POAG primary open angle glaucoma; EBMD epithelial/anterior basement membrane dystrophy; ACIOL anterior chamber intraocular lens; IOL intraocular lens; PCIOL posterior chamber intraocular lens; Phaco/IOL phacoemulsification  with intraocular lens placement; [place] photorefractive keratectomy; LASIK laser assisted in situ keratomileusis; HTN hypertension; DM diabetes mellitus; COPD chronic obstructive pulmonary disease

## 2017-12-25 ENCOUNTER — Encounter (INDEPENDENT_AMBULATORY_CARE_PROVIDER_SITE_OTHER): Payer: Self-pay | Admitting: Ophthalmology

## 2017-12-25 ENCOUNTER — Ambulatory Visit (INDEPENDENT_AMBULATORY_CARE_PROVIDER_SITE_OTHER): Payer: BLUE CROSS/BLUE SHIELD | Admitting: Ophthalmology

## 2017-12-25 DIAGNOSIS — H04123 Dry eye syndrome of bilateral lacrimal glands: Secondary | ICD-10-CM

## 2017-12-25 DIAGNOSIS — H25813 Combined forms of age-related cataract, bilateral: Secondary | ICD-10-CM | POA: Diagnosis not present

## 2017-12-25 DIAGNOSIS — H527 Unspecified disorder of refraction: Secondary | ICD-10-CM | POA: Diagnosis not present

## 2017-12-25 DIAGNOSIS — I10 Essential (primary) hypertension: Secondary | ICD-10-CM | POA: Diagnosis not present

## 2017-12-25 DIAGNOSIS — H35033 Hypertensive retinopathy, bilateral: Secondary | ICD-10-CM | POA: Diagnosis not present

## 2017-12-25 DIAGNOSIS — E119 Type 2 diabetes mellitus without complications: Secondary | ICD-10-CM | POA: Diagnosis not present

## 2017-12-25 DIAGNOSIS — H3581 Retinal edema: Secondary | ICD-10-CM | POA: Diagnosis not present

## 2018-03-02 DIAGNOSIS — Z6841 Body Mass Index (BMI) 40.0 and over, adult: Secondary | ICD-10-CM | POA: Diagnosis not present

## 2018-03-02 DIAGNOSIS — I10 Essential (primary) hypertension: Secondary | ICD-10-CM | POA: Diagnosis not present

## 2018-04-02 IMAGING — US US RENAL
1 series · 14 of 25 positions shown · non-contrast
Comparison: None.

CLINICAL DATA: Renal insufficiency, hypertension

EXAM:
RENAL / URINARY TRACT ULTRASOUND COMPLETE

[Series 1: us renal · 0.28mm/px · 14 of 39 slices shown]
[im 1/39]
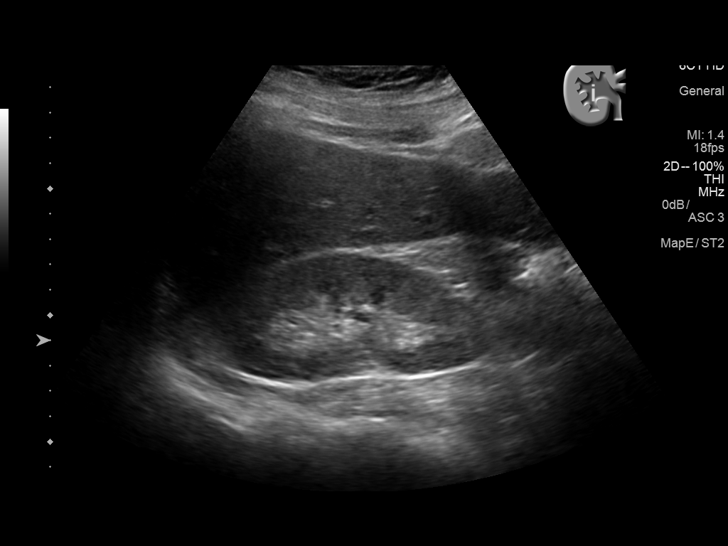
[im 4/39]
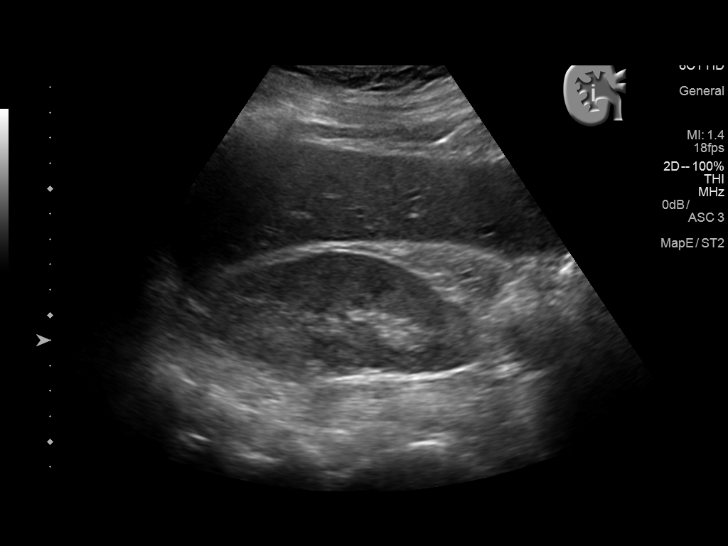
[im 7/39]
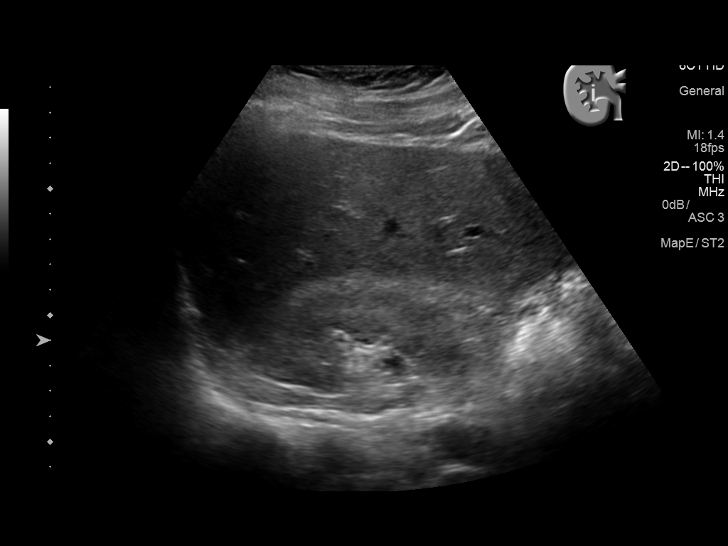
[im 10/39]
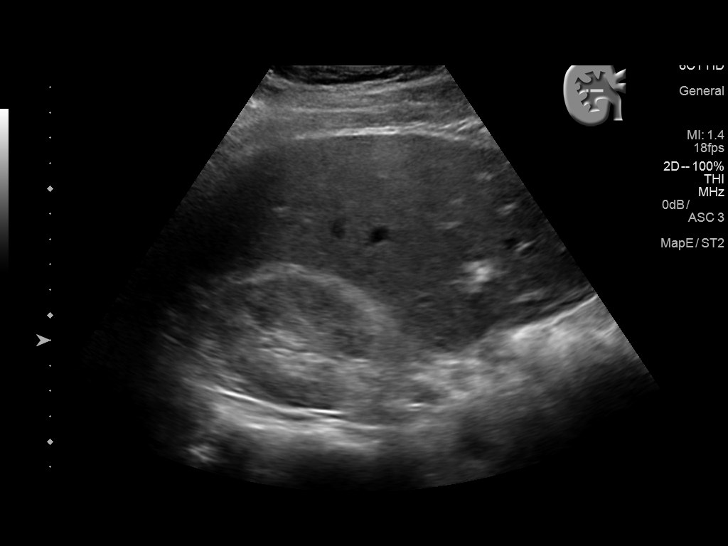
[im 13/39]
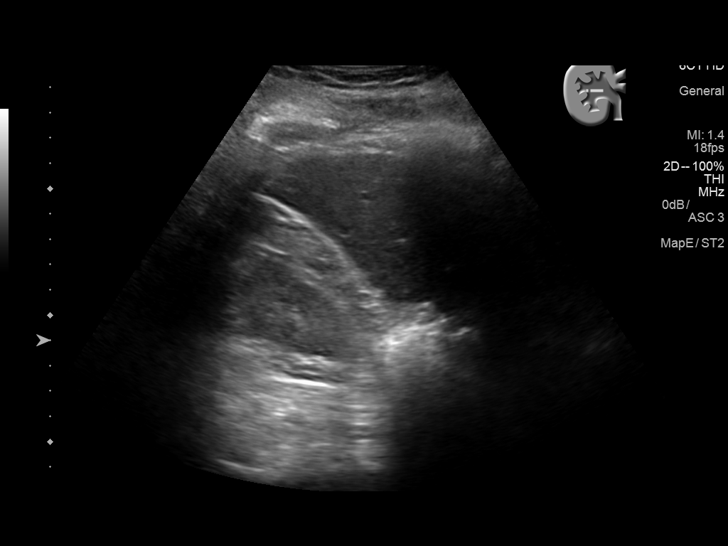
[im 15/39]
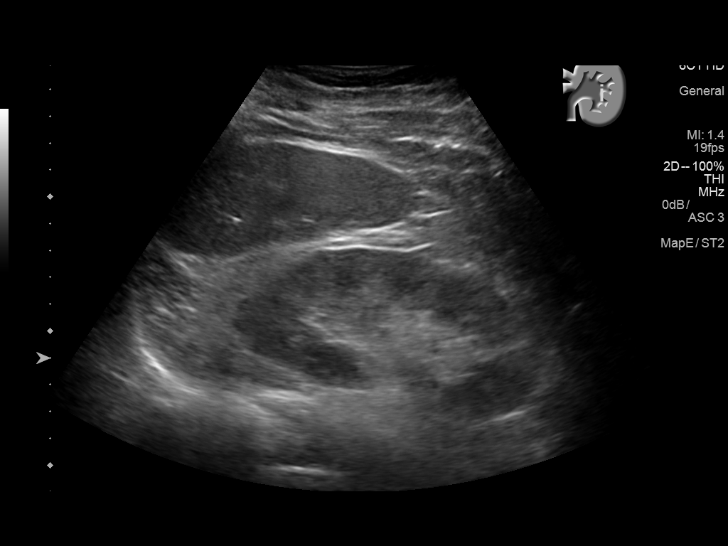
[im 18/39]
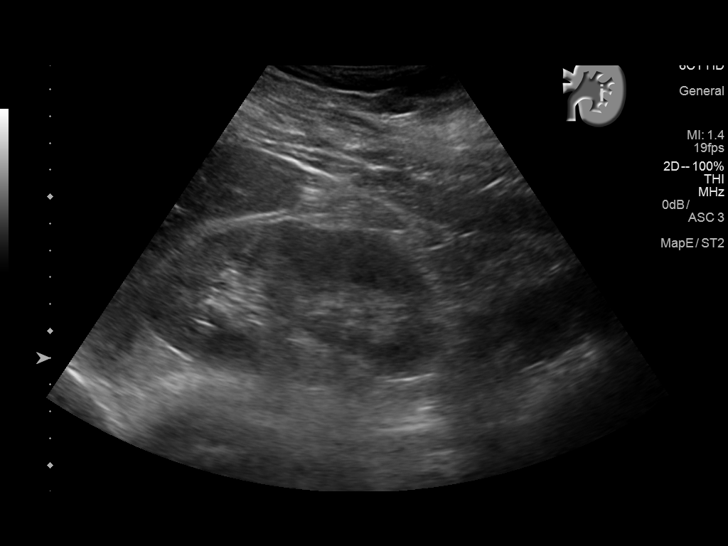
[im 21/39]
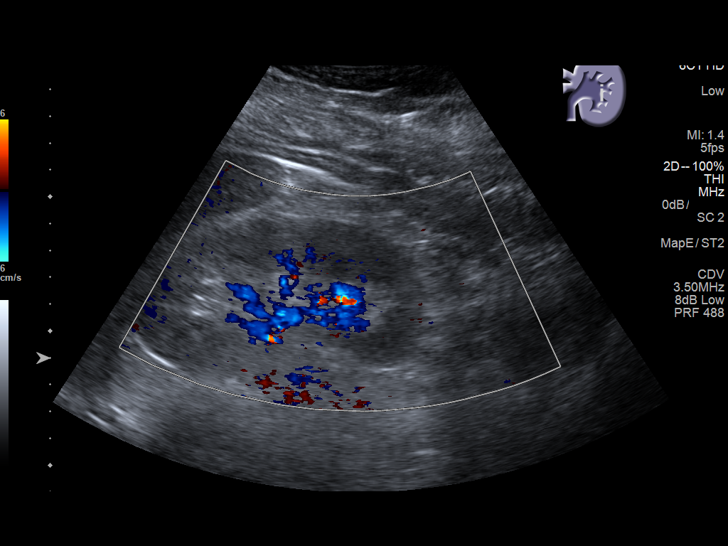
[im 24/39]
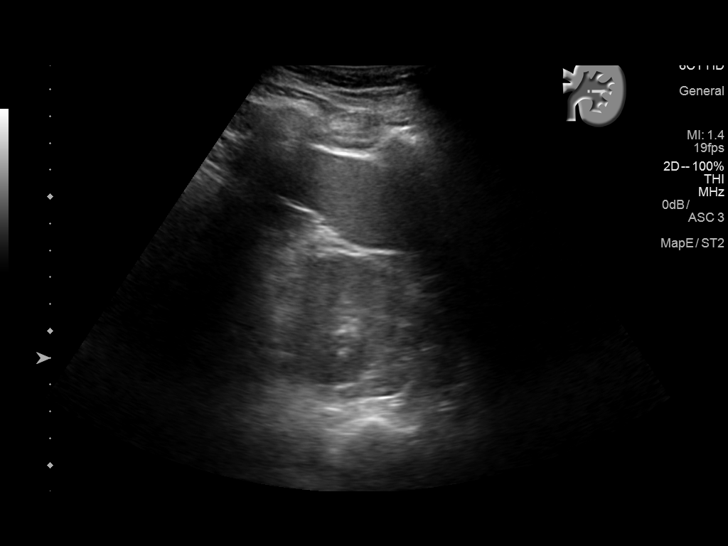
[im 26/39]
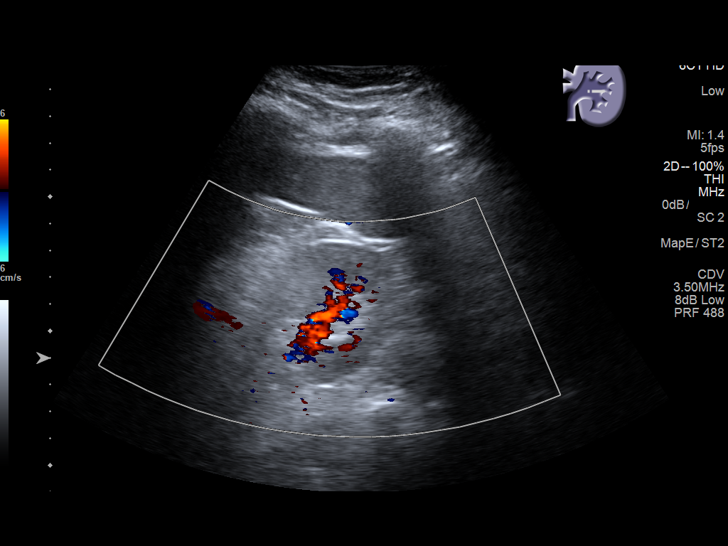
[im 29/39]
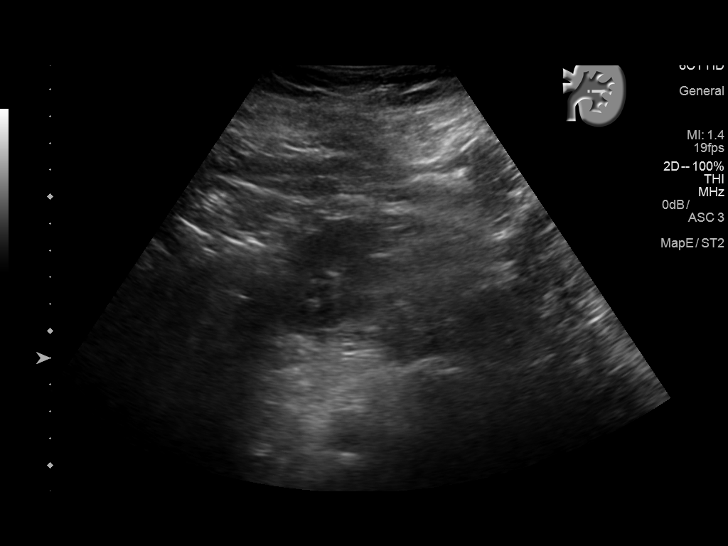
[im 32/39]
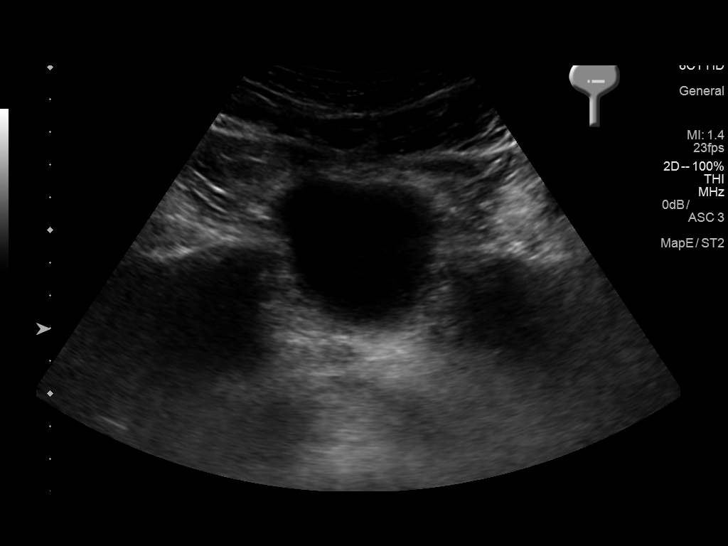
[im 35/39]
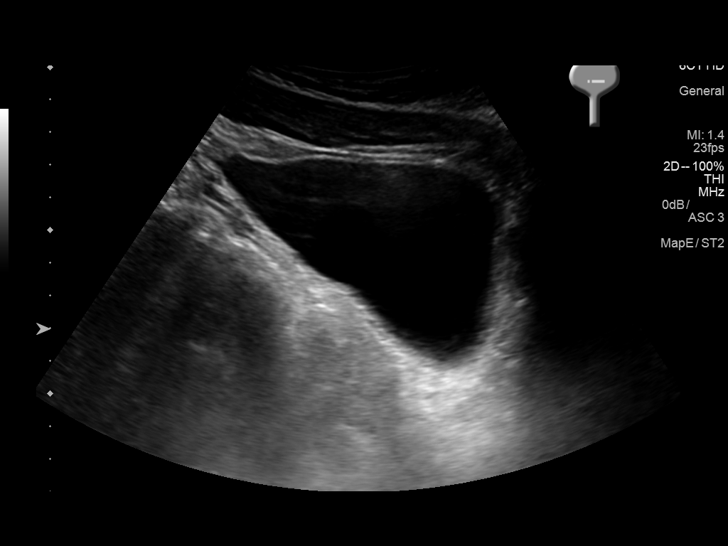
[im 39/39]
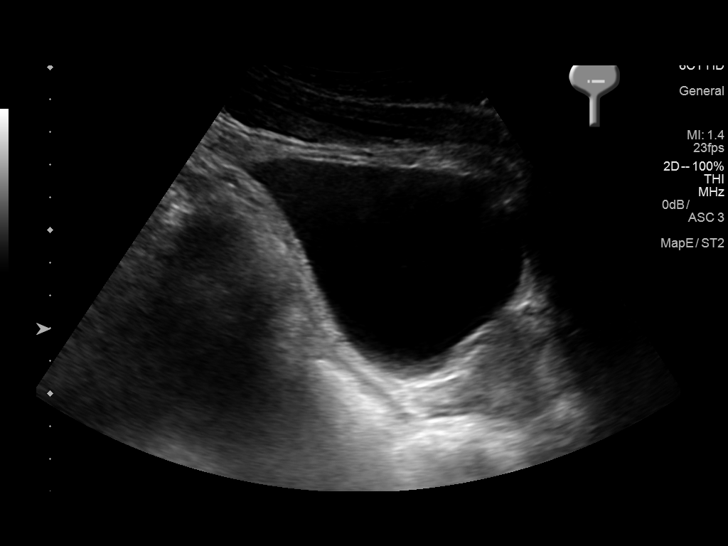

[14 of 25 positions shown; findings below may reference images not displayed]

FINDINGS: Right Kidney:

Length: 11.3 cm.. No hydronephrosis is seen. However the
echogenicity of the renal parenchyma appears increased consistent
with chronic renal medical disease.

Left Kidney:

Length: 11.8 cm.. No hydronephrosis is seen. The left renal
parenchymal also appears echogenic.

Bladder:

The urinary bladder is moderately urine distended with no
abnormality noted.
IMPRESSION: 1. No hydronephrosis.
2. Echogenic renal parenchyma consistent with chronic renal medical
disease.

## 2018-04-29 DIAGNOSIS — Z23 Encounter for immunization: Secondary | ICD-10-CM | POA: Diagnosis not present

## 2018-06-21 DIAGNOSIS — N179 Acute kidney failure, unspecified: Secondary | ICD-10-CM | POA: Diagnosis not present

## 2018-09-03 DIAGNOSIS — G479 Sleep disorder, unspecified: Secondary | ICD-10-CM | POA: Diagnosis not present

## 2018-09-08 DIAGNOSIS — I10 Essential (primary) hypertension: Secondary | ICD-10-CM | POA: Diagnosis not present

## 2018-09-08 DIAGNOSIS — Z6841 Body Mass Index (BMI) 40.0 and over, adult: Secondary | ICD-10-CM | POA: Diagnosis not present

## 2018-11-10 DIAGNOSIS — R400 Somnolence: Secondary | ICD-10-CM | POA: Diagnosis not present

## 2018-11-10 DIAGNOSIS — G478 Other sleep disorders: Secondary | ICD-10-CM | POA: Diagnosis not present

## 2018-11-10 DIAGNOSIS — R0683 Snoring: Secondary | ICD-10-CM | POA: Diagnosis not present

## 2018-11-10 DIAGNOSIS — R0681 Apnea, not elsewhere classified: Secondary | ICD-10-CM | POA: Diagnosis not present

## 2018-11-24 DIAGNOSIS — G4733 Obstructive sleep apnea (adult) (pediatric): Secondary | ICD-10-CM | POA: Diagnosis not present

## 2018-12-08 DIAGNOSIS — G4733 Obstructive sleep apnea (adult) (pediatric): Secondary | ICD-10-CM | POA: Diagnosis not present

## 2019-01-08 DIAGNOSIS — G4733 Obstructive sleep apnea (adult) (pediatric): Secondary | ICD-10-CM | POA: Diagnosis not present

## 2019-02-07 DIAGNOSIS — G4733 Obstructive sleep apnea (adult) (pediatric): Secondary | ICD-10-CM | POA: Diagnosis not present

## 2019-02-28 DIAGNOSIS — G4733 Obstructive sleep apnea (adult) (pediatric): Secondary | ICD-10-CM | POA: Diagnosis not present

## 2019-03-10 DIAGNOSIS — G4733 Obstructive sleep apnea (adult) (pediatric): Secondary | ICD-10-CM | POA: Diagnosis not present

## 2019-03-16 DIAGNOSIS — G4733 Obstructive sleep apnea (adult) (pediatric): Secondary | ICD-10-CM | POA: Diagnosis not present

## 2019-04-10 DIAGNOSIS — G4733 Obstructive sleep apnea (adult) (pediatric): Secondary | ICD-10-CM | POA: Diagnosis not present

## 2019-05-10 DIAGNOSIS — G4733 Obstructive sleep apnea (adult) (pediatric): Secondary | ICD-10-CM | POA: Diagnosis not present

## 2019-05-23 DIAGNOSIS — N179 Acute kidney failure, unspecified: Secondary | ICD-10-CM | POA: Diagnosis not present

## 2019-06-10 DIAGNOSIS — G4733 Obstructive sleep apnea (adult) (pediatric): Secondary | ICD-10-CM | POA: Diagnosis not present

## 2019-06-15 DIAGNOSIS — N179 Acute kidney failure, unspecified: Secondary | ICD-10-CM | POA: Diagnosis not present

## 2019-06-15 DIAGNOSIS — Z6841 Body Mass Index (BMI) 40.0 and over, adult: Secondary | ICD-10-CM | POA: Diagnosis not present

## 2019-06-15 DIAGNOSIS — Z23 Encounter for immunization: Secondary | ICD-10-CM | POA: Diagnosis not present

## 2019-06-15 DIAGNOSIS — I10 Essential (primary) hypertension: Secondary | ICD-10-CM | POA: Diagnosis not present

## 2019-07-06 DIAGNOSIS — N179 Acute kidney failure, unspecified: Secondary | ICD-10-CM | POA: Diagnosis not present

## 2019-07-06 DIAGNOSIS — I129 Hypertensive chronic kidney disease with stage 1 through stage 4 chronic kidney disease, or unspecified chronic kidney disease: Secondary | ICD-10-CM | POA: Diagnosis not present

## 2019-07-10 DIAGNOSIS — G4733 Obstructive sleep apnea (adult) (pediatric): Secondary | ICD-10-CM | POA: Diagnosis not present

## 2019-07-28 DIAGNOSIS — I129 Hypertensive chronic kidney disease with stage 1 through stage 4 chronic kidney disease, or unspecified chronic kidney disease: Secondary | ICD-10-CM | POA: Diagnosis not present

## 2019-08-10 DIAGNOSIS — G4733 Obstructive sleep apnea (adult) (pediatric): Secondary | ICD-10-CM | POA: Diagnosis not present

## 2019-12-02 DIAGNOSIS — L700 Acne vulgaris: Secondary | ICD-10-CM | POA: Diagnosis not present

## 2019-12-02 DIAGNOSIS — L720 Epidermal cyst: Secondary | ICD-10-CM | POA: Diagnosis not present

## 2019-12-30 DIAGNOSIS — G4733 Obstructive sleep apnea (adult) (pediatric): Secondary | ICD-10-CM | POA: Diagnosis not present

## 2020-01-17 DIAGNOSIS — E1169 Type 2 diabetes mellitus with other specified complication: Secondary | ICD-10-CM | POA: Diagnosis not present

## 2020-01-17 DIAGNOSIS — I10 Essential (primary) hypertension: Secondary | ICD-10-CM | POA: Diagnosis not present

## 2020-01-17 DIAGNOSIS — E782 Mixed hyperlipidemia: Secondary | ICD-10-CM | POA: Diagnosis not present

## 2020-02-15 DIAGNOSIS — G4733 Obstructive sleep apnea (adult) (pediatric): Secondary | ICD-10-CM | POA: Diagnosis not present

## 2020-02-29 DIAGNOSIS — E119 Type 2 diabetes mellitus without complications: Secondary | ICD-10-CM | POA: Diagnosis not present

## 2020-04-16 DIAGNOSIS — E1169 Type 2 diabetes mellitus with other specified complication: Secondary | ICD-10-CM | POA: Diagnosis not present

## 2020-04-16 DIAGNOSIS — I10 Essential (primary) hypertension: Secondary | ICD-10-CM | POA: Diagnosis not present

## 2020-04-16 DIAGNOSIS — E782 Mixed hyperlipidemia: Secondary | ICD-10-CM | POA: Diagnosis not present

## 2020-04-16 DIAGNOSIS — Z23 Encounter for immunization: Secondary | ICD-10-CM | POA: Diagnosis not present

## 2020-05-24 DIAGNOSIS — E119 Type 2 diabetes mellitus without complications: Secondary | ICD-10-CM | POA: Diagnosis not present

## 2020-05-24 DIAGNOSIS — R809 Proteinuria, unspecified: Secondary | ICD-10-CM | POA: Diagnosis not present

## 2020-05-24 DIAGNOSIS — I129 Hypertensive chronic kidney disease with stage 1 through stage 4 chronic kidney disease, or unspecified chronic kidney disease: Secondary | ICD-10-CM | POA: Diagnosis not present

## 2021-08-23 DIAGNOSIS — R809 Proteinuria, unspecified: Secondary | ICD-10-CM | POA: Diagnosis not present

## 2021-08-23 DIAGNOSIS — I129 Hypertensive chronic kidney disease with stage 1 through stage 4 chronic kidney disease, or unspecified chronic kidney disease: Secondary | ICD-10-CM | POA: Diagnosis not present

## 2021-08-23 DIAGNOSIS — E119 Type 2 diabetes mellitus without complications: Secondary | ICD-10-CM | POA: Diagnosis not present

## 2021-09-02 DIAGNOSIS — I129 Hypertensive chronic kidney disease with stage 1 through stage 4 chronic kidney disease, or unspecified chronic kidney disease: Secondary | ICD-10-CM | POA: Diagnosis not present

## 2021-09-02 DIAGNOSIS — I10 Essential (primary) hypertension: Secondary | ICD-10-CM | POA: Diagnosis not present

## 2021-10-11 DIAGNOSIS — I129 Hypertensive chronic kidney disease with stage 1 through stage 4 chronic kidney disease, or unspecified chronic kidney disease: Secondary | ICD-10-CM | POA: Diagnosis not present

## 2022-02-12 DIAGNOSIS — G4733 Obstructive sleep apnea (adult) (pediatric): Secondary | ICD-10-CM | POA: Diagnosis not present

## 2022-03-26 DIAGNOSIS — R69 Illness, unspecified: Secondary | ICD-10-CM | POA: Diagnosis not present

## 2022-05-12 DIAGNOSIS — G4733 Obstructive sleep apnea (adult) (pediatric): Secondary | ICD-10-CM | POA: Diagnosis not present

## 2022-05-28 DIAGNOSIS — R69 Illness, unspecified: Secondary | ICD-10-CM | POA: Diagnosis not present

## 2022-06-12 DIAGNOSIS — G4733 Obstructive sleep apnea (adult) (pediatric): Secondary | ICD-10-CM | POA: Diagnosis not present

## 2022-07-12 DIAGNOSIS — G4733 Obstructive sleep apnea (adult) (pediatric): Secondary | ICD-10-CM | POA: Diagnosis not present
